# Patient Record
Sex: Male | Born: 1935 | Race: White | Hispanic: No | Smoking: Former smoker
Health system: Southern US, Community
[De-identification: ages and names within clinical notes are randomized; demographics above are authoritative.]

## PROBLEM LIST (undated history)

## (undated) DIAGNOSIS — E039 Hypothyroidism, unspecified: Secondary | ICD-10-CM

## (undated) DIAGNOSIS — D649 Anemia, unspecified: Secondary | ICD-10-CM

## (undated) DIAGNOSIS — I10 Essential (primary) hypertension: Secondary | ICD-10-CM

## (undated) DIAGNOSIS — K449 Diaphragmatic hernia without obstruction or gangrene: Secondary | ICD-10-CM

## (undated) DIAGNOSIS — I219 Acute myocardial infarction, unspecified: Secondary | ICD-10-CM

## (undated) DIAGNOSIS — R42 Dizziness and giddiness: Secondary | ICD-10-CM

## (undated) DIAGNOSIS — M1990 Unspecified osteoarthritis, unspecified site: Secondary | ICD-10-CM

## (undated) DIAGNOSIS — Z8719 Personal history of other diseases of the digestive system: Secondary | ICD-10-CM

## (undated) DIAGNOSIS — I4891 Unspecified atrial fibrillation: Secondary | ICD-10-CM

## (undated) DIAGNOSIS — J449 Chronic obstructive pulmonary disease, unspecified: Secondary | ICD-10-CM

## (undated) DIAGNOSIS — Z8679 Personal history of other diseases of the circulatory system: Secondary | ICD-10-CM

## (undated) DIAGNOSIS — N400 Enlarged prostate without lower urinary tract symptoms: Secondary | ICD-10-CM

## (undated) DIAGNOSIS — R0602 Shortness of breath: Secondary | ICD-10-CM

## (undated) DIAGNOSIS — I2510 Atherosclerotic heart disease of native coronary artery without angina pectoris: Secondary | ICD-10-CM

## (undated) DIAGNOSIS — E785 Hyperlipidemia, unspecified: Secondary | ICD-10-CM

## (undated) HISTORY — DX: Unspecified atrial fibrillation: I48.91

## (undated) HISTORY — DX: Personal history of other diseases of the digestive system: Z87.19

## (undated) HISTORY — DX: Personal history of other diseases of the circulatory system: Z86.79

## (undated) HISTORY — PX: BOWEL RESECTION: SHX1257

## (undated) HISTORY — PX: RETINAL DETACHMENT SURGERY: SHX105

## (undated) HISTORY — DX: Hyperlipidemia, unspecified: E78.5

## (undated) HISTORY — DX: Atherosclerotic heart disease of native coronary artery without angina pectoris: I25.10

## (undated) HISTORY — PX: HERNIA REPAIR: SHX51

## (undated) HISTORY — DX: Dizziness and giddiness: R42

## (undated) HISTORY — PX: TONSILLECTOMY AND ADENOIDECTOMY: SHX28

## (undated) HISTORY — DX: Anemia, unspecified: D64.9

## (undated) HISTORY — DX: Diaphragmatic hernia without obstruction or gangrene: K44.9

## (undated) HISTORY — DX: Hypothyroidism, unspecified: E03.9

## (undated) HISTORY — DX: Unspecified osteoarthritis, unspecified site: M19.90

## (undated) HISTORY — DX: Benign prostatic hyperplasia without lower urinary tract symptoms: N40.0

## (undated) HISTORY — DX: Essential (primary) hypertension: I10

---

## 1993-08-28 DIAGNOSIS — I219 Acute myocardial infarction, unspecified: Secondary | ICD-10-CM

## 1993-08-28 HISTORY — DX: Acute myocardial infarction, unspecified: I21.9

## 1997-05-28 HISTORY — PX: CORONARY ANGIOPLASTY WITH STENT PLACEMENT: SHX49

## 2003-12-21 ENCOUNTER — Ambulatory Visit (HOSPITAL_COMMUNITY): Admission: RE | Admit: 2003-12-21 | Discharge: 2003-12-21 | Payer: Self-pay | Admitting: Cardiovascular Disease

## 2003-12-21 HISTORY — PX: CARDIAC CATHETERIZATION: SHX172

## 2004-01-21 ENCOUNTER — Inpatient Hospital Stay (HOSPITAL_COMMUNITY): Admission: RE | Admit: 2004-01-21 | Discharge: 2004-02-10 | Payer: Self-pay | Admitting: Surgery

## 2004-01-21 HISTORY — PX: CORONARY ARTERY BYPASS GRAFT: SHX141

## 2004-01-29 ENCOUNTER — Encounter: Payer: Self-pay | Admitting: Cardiovascular Disease

## 2010-08-10 ENCOUNTER — Ambulatory Visit: Payer: Self-pay | Admitting: Cardiovascular Disease

## 2010-09-18 ENCOUNTER — Encounter: Payer: Self-pay | Admitting: Surgery

## 2011-01-13 NOTE — Discharge Summary (Signed)
NAME:  William Chambers, PROUTY NO.:  [id number]   MEDICAL RECORD NO.:  [id number]                   PATIENT TYPE:  INP   LOCATION:  2007                                 FACILITY:  [facility]   PHYSICIAN:  Evelene Croon, M.D.                  DATE OF BIRTH:  [birth year]   DATE OF ADMISSION:  01/21/2004  DATE OF DISCHARGE:  02/10/2004                                 DISCHARGE SUMMARY   HISTORY OF PRESENT ILLNESS:  Mr. William Chambers is a 77 year old active gentleman  referred to Dr. Laneta Simmers with a history of coronary artery disease status post  both myocardial infarction and PTCA of the right coronary artery in 1998.  The patient presented recently with atypical episodes of chest pain.  He has  multiple aches and pains but primarily described substernal chest tightness  or what he calls crowding.  The symptoms sometimes occur with exertion but  also at rest.  He has also noted indigestion with belching.  The episodes  usually resolve spontaneously.  Due to these symptoms cardiology evaluation  was undertaken and this included a stress Cardiolite exam that showed ST  segment depression at a low exercise level and showed inferior myocardial  infarction.  He underwent a cardiac catheterization on December 21, 2003 which  showed significant three vessel coronary disease.  The left main had a 30%  distal stenosis.  The LAD was calcified throughout its course.  There was a  50-70% mid LAD stenosis.  The distal LAD beyond the second diagonal branch  had a long 90% stenosis.  The first and second diagonal branches both had  about 50-60% stenosis.  The left circumflex had a 95% hazy lesion at its  proximal segment and then a 60-70% mid arterial stenosis.  The right  coronary artery was a large dominant vessel.  There is a 60% stenosis before  the takeoff of the posterior descending branch.  There is about 95% stenosis  of the posterolateral branch.  The posterior descending branch had  disease  in its mid portion with a moderate degree of stenosis that Dr. Laneta Simmers  estimated to be about 60%.  Left ventricular ejection fraction was 65% with  mid inferior akinesis.  There was no gradient across the aortic valve and no  significant mitral regurgitation.  Due to these findings Dr. Laneta Simmers  recommended surgical revascularization and he was admitted this  hospitalization for the procedure.   ALLERGIES:  None.   PAST MEDICAL HISTORY:  1. Coronary artery disease as described above.  2. Hyperlipidemia.  3. Hypertension.  4. Hypothyroidism.  5. Adult-onset diabetes mellitus.  6. History of gout.  7. Borderline glaucoma.  8. 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  9. Additionally he has had three inguinal hernia surgeries as well as     appendectomy, tonsillectomy and  adenoidectomy.   MEDICATIONS PRIOR TO ADMISSION:  1. Lopressor 50 mg b.i.d.  2. Allopurinol 300 mg once daily.  3. Uroxatral 10 mg daily.  4. Levoxyl 0.05 mg once daily.  5. Glucophage 500 mg once daily.  6. Lipitor 10 mg once daily.  7. Avapro 150 mg once daily.  8. Digoxin 0.25 mg once daily.  9. Axid 75 mg four times a day.  10.      Aspirin 325 mg daily.  11.      Tylenol 500 mg q.i.d. p.r.n.  12.      Claritin 10 mg once daily.  13.      Sublingual nitroglycerin p.r.n.  14.      Inderal 10 mg p.r.n. for irregular heart rhythm.  15.      Soma p.r.n.  16.      Alpha lipoic acid 300 mg four to six times daily.  17.      Evening primrose oil 500 mg two to four times per day.  18.      Multivitamins.   FAMILY HISTORY, SOCIAL HISTORY, REVIEW OF SYMPTOMS AND PHYSICAL EXAMINATION:  Please see the history and physical done at the time of admission.   HOSPITAL COURSE:  The patient was admitted, electively taken to the cardiac  operating room where he underwent the following procedure on Jan 21, 2004,  coronary artery bypass grafting x6.   The following grafts were placed:  (1)  The left internal mammary artery to the left anterior descending coronary  artery, (2) a saphenous vein graft to the second diagonal branch of the left  anterior descending coronary artery, (3) sequential saphenous vein graft to  the first diagonal branch of the left anterior descending and then the  obtuse marginal coronary artery and the posterolateral branch of the right  coronary artery, (4) a saphenous vein graft to the posterior descending  coronary artery.  This procedure was performed by Evelene Croon, M.D.  Patient tolerated procedure well, was taken to the surgical intensive care  unit in stable condition.   POSTOPERATIVE HOSPITAL COURSE:  Patient has had a complex postoperative  hospital course due to several factors.  Initially the patient was extubated  without difficulty and neurologically intact with very stable hemodynamics.  All routine lines, monitors and drainage devices were discontinued in the  standard fashion.  He did develop an initial postoperative atrial  fibrillation.  He did have a history of some irregular heart rhythms  presumed to be atrial fibrillation and he was treated with chemical  cardioversion.  Patient also had a postoperative anemia that was felt to  require transfusion on Jan 25, 2004 for a hemoglobin of 7.1.  He responded  well to transfusion.  He continued to have difficulty with atrial  fibrillation and it was subsequently felt that he required coumadinization.  Coumadin was started and daily INR's were monitored.  The patient on January 28, 2004 had an episode of right upper quadrant pain with nausea.  It was  unclear of the exact etiology but he also had an associated hypotension.  EKG showed no acute changes.  He does have a history of some gallbladder  disease and it was uncertain if this could be an issue.  He was transferred to the intensive care unit and enzymes were obtained.  Abdominal examination   was followed closely.  He remained hypotensive and required a fluid  challenge as well as dopamine.  He developed increasing shortness of breath  and diaphoresis and subsequently a Swan-Ganz catheter was placed.  Initial  CVP was 12-14 and PAD was 8-10 but he continued to have persistent  hypotension.  The patient then developed increasing respiratory difficulties  and blood pressure became unmeasurable and he required CPR.  He was treated  with advanced cardiac life support modalities including CPR, epinephrine,  dopamine and bicarbonate and stabilized.  A stat echocardiogram was obtained  and this showed an anterior fluid collection in the pericardium.  He was  uncertain if this was the etiology of the hypotension or secondary to CPR  but Dr. Sheliah Plane felt as though the patient would require an  exploration in the operating room and this was done at 4 a.m. on January 29, 2004 by him.  The chest was explored and there was some evidence of anterior  clot.  The anastomoses and all potential bleeding points were evaluated and  were felt to be hemostatic.  The patient's sternum was closed and he  returned to the intensive care unit.   POSTOPERATIVE HOSPITAL COURSE FOLLOWING RE-EXPLORATION:  The patient has  shown a good and gradual improvement following this second procedure.  The  patient has not been restarted on Coumadin and he has subsequently returned  to a normal sinus rhythm on current pharmacological regimen.  The patient's  laboratory values are stable.  His incisions are healing well without signs  of infection.  He has had some difficulty with his benign prostatic  hyperplasia and urology consultation has been obtained with Dr. Vonita Moss.  He has undergone a post-Foley catheter voiding challenge.  He initially  failed this and catheter had to be returned.  He is currently being trained  for self-catheterization if he fails subsequent retrial at removal of the  Foley.  The  patient has had significant difficulty relating to  rehabilitation due to chronic deconditioning as well as the acute  hospitalization.  He has multiple complaints regarding his musculoskeletal  system and has had both physical and occupational therapy consultations and  recommendations for further treatment.  He is responding well albeit  somewhat slowly and is felt to be a candidate for further rehabilitation in  the nursing home setting as he is not felt to be safe at this time for self-  care.  Overall patient is felt at this time to be stable for transfer in the  morning of February 10, 2004 pending morning round re-evaluation.   CURRENT MEDICATIONS:  1. Dulcolax 10 mg daily.  2. Colace 200 mg daily.  3. Aspirin 325 mg daily.  4. Protonix 40 mg daily.  5. Digoxin 0.25 mg daily.  6. Synthroid 50 mcg daily.  7. Zyloprim 300 mg daily.  8. Claritin 10 mg daily.  9. Plavix 75 mg daily.  10.      Lopressor 50 mg b.i.d. 11.      Colchicine 0.6 mg daily.  12.      Guaifenesin 600 mg b.i.d.  13.      Amaryl 2 mg daily.  14.      Lasix 40 mg once daily.  15.      Potassium chloride 20 mEq daily.  16.      For pain Tylox one to two q.4-6h. p.r.n. as needed.   CONDITION ON DISCHARGE:  Stable and improving.   FINAL DIAGNOSIS:  Severe multivessel coronary artery disease as described  above now status post surgical revascularization with re-exploration due to  cardiac tamponade and cardiopulmonary arrest.  OTHER DIAGNOSES INCLUDE:  1. Hyperlipidemia.  2. Hypothyroidism.  3. Hypertension.  4. Gout.  5. Atrial fibrillation.  6. Postoperative anemia.  7. 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  8. History of inguinal hernia repair x3.  9. History of appendectomy, tonsillectomy, adenoidectomy.   RECENT LABORATORIES:  Most recent hemoglobin and hematocrit dated February 05, 2004 are stable at 8.7 and 26.4.  Most recent chemistry as follows:  Sodium  132, potassium 3.6  for which he received supplement, chloride 99, CO2 24,  BUN 9, creatinine 1, glucose 100.   INSTRUCTIONS:  Followup:  Patient should see Dr. Evelene Croon at the  [facility] on March 15, 2004  at 12:30 p.m. with arrival 1 hour prior to appointment at the [facility] for a chest x-ray.  The [facility]  telephone number is as follows [phone].  Additionally the patient  should follow up 2 weeks post discharge from the hospital to his  cardiologist's office Kristeen Miss, M.D.  He should call the office for  this.   OTHER INSTRUCTIONS:  Patient should continue physical and occupational  therapy for rehabilitation and deconditioning.  Patient has also been  instructed on self-catheterization using Foley for urinary retention.  Patient should follow up with Dr. Vonita Moss or his primary urologist in 2-3  weeks or sooner as required by clinical symptoms.   INSTRUCTIONS FOR INCISION CARE:  The patient may shower, clean incisions  gently with soap and water, he is encouraged also to ambulate increasing as  tolerated with use of walker.  Additionally when not ambulating he is  instructed to keep his lower extremities elevated above the level of his  heart as possible.  He is also instructed not to lift anything greater than  10 pounds maximum weight and to use continued care in regards to pushing and  pulling movements that cause strain on his sternum.      Rowe Clack, P.A.-C.                    Evelene Croon, M.D.    Sherryll Burger  D:  02/09/2004  T:  02/09/2004  Job:  [id number]   cc:   Evelene Croon, M.D.  [address]  [place]  [place] [address]  Fax: [phone]   Vesta Mixer, M.D.  [address], [address]  [place]  [place] [address]  Fax: [phone]   Maretta Bees. Vonita Moss, M.D.  [address]  [place]  [place] [address]  Fax: [phone]

## 2011-01-13 NOTE — Op Note (Signed)
NAME:  William Chambers, William Chambers NO.:  [id number]   MEDICAL RECORD NO.:  [id number]                   PATIENT TYPE:  INP   LOCATION:  2306                                 FACILITY:  [facility]   PHYSICIAN:  Evelene Croon, M.D.                  DATE OF BIRTH:  Feb 18, 1923   DATE OF PROCEDURE:  01/21/2004  DATE OF DISCHARGE:                                 OPERATIVE REPORT   PREOPERATIVE DIAGNOSIS:  Severe three-vessel coronary artery disease.   POSTOPERATIVE DIAGNOSIS:  Severe three-vessel coronary artery disease.   OPERATIVE PROCEDURES:  1. Median sternotomy.  2. Extracorporeal circulation.  3. Coronary artery bypass graft surgery x6 using a left internal mammary     artery graft to the left anterior descending coronary artery, with a     saphenous vein graft to the second diagonal branch of the left anterior     descending coronary artery, with a sequential saphenous vein graft to the     first diagonal branch of the left anterior descending and then the obtuse     marginal coronary artery and the posterolateral branch of the right     coronary artery, and a saphenous vein graft to the posterior descending     coronary artery.  4. Endoscopic vein harvesting from both legs.   ATTENDING SURGEON:  Evelene Croon, M.D.   ASSISTANT:  Jerold Coombe, P.A.   SECOND ASSISTANT:  Erskine Squibb, R.N.F.A.   ANESTHESIA:  General endotracheal.   CLINICAL HISTORY:  This patient is a 77 year old gentleman with a history of  coronary artery disease, status post myocardial infarction and PTCA of the  right coronary artery in 1998.  He recently presented with atypical chest  pain.  Stress Cardiolite showed ST depression and evidence of previous  inferior myocardial infarction.  A cardiac catheterization on December 21, 2003, shows severe three-vessel coronary artery disease.  The left main had  about 30% distal stenosis.  The LAD was calcified throughout its course.  There was a  50-75% mid-LAD stenosis.  The distal LAD beyond the second  diagonal branch had a long 90% stenosis.  The first and second diagonal  branches both had about 50-60% stenosis.  The left circumflex had a 95% hazy  lesion at its proximal portion and then a 60-70% midvessel stenosis.  The  right coronary artery was a large, dominant vessel that had 60% stenosis  before the takeoff of the posterior descending branch and about 95% stenosis  in the posterolateral branch.  The posterior descending branch had stenosis  in its midportion of at least 60%.  Left ventricular ejection fraction was  about 65% with midinferior akinesis.  There was no gradient across the  aortic valve and no significant mitral regurgitation.  After review of the  angiogram and examination of the patient, it was felt that coronary artery  bypass graft surgery was the  best treatment.  I discussed the operative  procedure with the patient and his family, including alternatives, benefits,  and risks, including bleeding, blood transfusion, infection, stroke,  myocardial infarction, graft failure, and death.  He understood and agreed  to proceed.   OPERATIVE PROCEDURE:  The patient was taken to the operating room and placed  on the table in supine position.  After induction of general endotracheal  anesthesia, a Foley catheter was placed in the bladder using sterile  technique.  Then the chest, abdomen, and both lower extremities were prepped  and draped in the usual sterile manner.  The chest was entered through a  median sternotomy incision and the pericardium opened in the midline.  Examination of the heart showed good ventricular contractility.  The  ascending aorta had no palpable plaques in it.   Then the left internal mammary artery was harvested from the chest wall as a  pedicle graft.  This was a medium-caliber vessel with excellent blood flow  through it.  At the same time a segment of greater saphenous vein was   harvested from both thighs using endoscopic vein harvest technique.  This  vein was of medium size and good quality.   Then the patient was heparinized and when an adequate activated clotting  time was achieved, the distal ascending aorta was cannulated using a 20  French aortic cannula for arterial inflow.  Venous outflow was achieved  using a two-stage venous cannula through the right atrial appendage.  An  antegrade cardioplegia and vent cannula was inserted in the aortic root.   The patient was placed on cardiopulmonary bypass and the distal coronary  arteries identified.  The LAD was a medium-sized graftable vessel.  The  first diagonal was intramyocardial but was located in its midportion and was  a large, graftable vessel.  The second diagonal branch was a large vessel  proximally.  It was diffusely diseased, and I had to graft it distally to  get beyond the disease, where it was a smaller vessel.  The first marginal  branch was a large, graftable vessel.  The posterolateral branch of the  right coronary artery was small but graftable.  The posterior descending  coronary artery was diffusely diseased in its proximal and midportion and  was a large, graftable vessel distally beyond the disease.   Then the aorta was crossclamped and 500 mL of cold blood antegrade  cardioplegia was administered in the aortic root with quick arrest of the  heart.  Systemic hypothermia to 20 degrees Centigrade and topical  hypothermia with iced saline was used.  A temperature probe was placed in  the septum and an insulating pad in the pericardium.   The first distal anastomosis was performed to the posterior descending  coronary artery.  The internal diameter was about 1.6 mm.  The conduit used  was a segment of greater saphenous vein and the anastomosis performed in an  end-to-side manner using continuous 7-0 Prolene suture.  Flow was measured through the graft and was excellent.   The second  distal anastomosis was performed to the second diagonal branch.  The internal diameter of this vessel distally was about 1.5 mm.  The conduit  used was a second segment of greater saphenous vein and the anastomosis  performed in an end-to-side manner using continuous 7-0 Prolene suture.  Flow was measured through the graft and was excellent.  Then a dose of  cardioplegia was given down both vein grafts and in the  aortic root.   The third distal anastomosis was performed to the first diagonal branch.  The internal diameter was about 1.6 mm.  The conduit used was a third  segment of greater saphenous vein and the anastomosis performed in a  sequential side-to-side manner using continuous 7-0 Prolene suture.  Flow  was measured through the graft and was excellent.   The fourth distal anastomosis was performed to the obtuse marginal branch.  The internal diameter was about 1.75 mm.  The conduit used was the same  segment of greater saphenous vein and the anastomosis was performed in a  sequential side-to-side manner using continuous 7-0 Prolene suture.  Flow  was again measured through the graft and was excellent.   The fifth distal anastomosis was performed to the posterolateral branch of  the right coronary artery.  The internal diameter of this vessel was about  1.5 mm.  The conduit used was the same segment of greater saphenous vein and  the anastomosis performed in a sequential end-to-side manner using  continuous 8-0 Prolene suture.  Flow was measured through that graft and was  excellent.   The sixth distal anastomosis was performed to the midportion of the left  anterior descending coronary artery.  The internal diameter of this vessel  was 1.75 mm.  The conduit used was the left internal mammary graft, and this  was brought through an opening in the left pericardium anterior to the  phrenic nerve.  It was anastomosed to the LAD in an end-to-side manner using  continuous 8-0  Prolene suture.  The pedicle was tacked to the epicardium  with 6-0 Prolene suture.  The patient rewarmed to 37 degrees Centigrade and  the clamp removed from the mammary pedicle.  There was rapid warming of the  ventricular septum and return of spontaneous ventricular fibrillation.  The  crossclamp was removed with a time of 92 minutes and the patient  spontaneously converted to sinus rhythm.   A partial occlusion clamp was placed on the aortic root and the three  proximal vein graft anastomoses were performed in an end-to-side manner  using continuous 6-0 Prolene suture.  The clamp was removed and the vein  grafts de-aired and the clamps removed from them.  The proximal and distal  anastomoses appeared hemostatic and the alignment of the grafts  satisfactory.  Graft markers were placed around the proximal anastomoses.  Two temporary right ventricular and right atrial pacing wires were placed and brought out through the skin.   When the patient had rewarmed to 37 degrees Centigrade, he was weaned from  cardiopulmonary bypass on no inotropic agents.  Total bypass time was 143  minutes.  Cardiac function appeared excellent with a cardiac output of 5  L/min.  Protamine was given and the venous and aortic cannulas were removed  without difficulty.  Hemostasis was achieved.  The patient was given 10  units of platelets due to coagulopathy, which was noted at the beginning of  the case and continued afterwards.  This resulted in adequate hemostasis.  Four chest tubes were placed with a tube in the posterior pericardium, one  in the left pleural space, one in the right pleural space, and one in the  anterior mediastinum.  The pericardium was loosely reapproximated over the  heart.  The sternum was closed with #6 stainless steel wires.  The fascia  was closed with continuous #1 Vicryl suture.  The subcutaneous tissue was  closed with continuous 2-0 Vicryl and the  skin with 3-0 Vicryl  subcuticular  closure.  The lower extremity vein harvest sites were closed in layers in a  similar manner.  The sponge, needle, and instrument counts were correct  according to the scrub nurse.  Dry sterile dressings were applied over the  incisions and around the chest tubes, which were hooked to Pleuravac  suction.  The patient remained hemodynamically stable and was transported to  the SICU in guarded but stable condition.                                               Evelene Croon, M.D.    BB/MEDQ  D:  01/21/2004  T:  01/22/2004  Job:  [id number]   cc:   Vesta Mixer, M.D.  [address], [address]  [place]  [place] [address]  Fax: [phone]   [facility]

## 2011-01-13 NOTE — Consult Note (Signed)
NAME:  William Chambers, William Chambers NO.:  [id number]   MEDICAL RECORD NO.:  [id number]                   PATIENT TYPE:  INP   LOCATION:  2007                                 FACILITY:  [facility]   PHYSICIAN:  Maretta Bees. Vonita Moss, M.D.             DATE OF BIRTH:  February 03, 1923   DATE OF CONSULTATION:  02/08/2004  DATE OF DISCHARGE:                                   CONSULTATION   REASON FOR CONSULTATION:  I was asked to see this pleasant 77 year old white  male who has been hospitalized for severe three vessel coronary artery  bypass grafting and a previous history of coronary artery disease.   HISTORY OF PRESENT ILLNESS:  He was seen by Dr. Deloris Ping Nahser for a  cardiac workup thinking that he might have a TUR of the prostate because of  increased voiding symptoms despite treatment with Cardura and Flomax over  the years.  He was seen by Dr. Hollice Espy in [place], [place].  The cardiac workup lead to abnormal findings prompting his surgery.   REVIEW OF SYMPTOMS:  On admission, he also presented with a history of  environmental allergies, migraine headaches, occasional diarrhea from  previous small bowel surgery, and history of gout.   PAST SURGICAL HISTORY:  1. He has a previous surgery including tonsillectomy.  2. Appendectomy.  3. Inguinal hernia repairs x 3.  4. Small bowel resection.  5. Retinal surgery.   MEDICATIONS:  1. Calcium and vitamin D.  2. Lopressor.  3. Allopurinol.  4. Uroxatral 10 mg.  5. Levoxyl 0.05 mg.  6. Glucophage 500 mg.  7. Lipitor 10 mg.  8. Avapro 150 mg q.d.  9. Digoxin 0.25 mg.  10.      Claritin 10 mg q.d.  11.      Axid 75 mg t.i.d.  12.      Aspirin 325 mg.  13.      Nitrostat p.r.n.  14.      Inderal.  15.      Soma.  16.      Multiple vitamins.   ALLERGIES:  Denied.   SOCIAL HISTORY:  He has a history of alcohol abuse.  He does not smoke  cigarettes.   FAMILY HISTORY:  Noncontributory.   HOSPITAL  COURSE:  During the course of this hospital stay, he had Foley  catheter and since this has been removed he has complete voiding.  The Foley  catheter was put in because of a residual urine of 900 cc.  Foley catheter  is now in place and the patient is comfortable.  Dr. Jillyn Hidden B. Truett Perna said  that he thinks his recent PSA level was in the level of 5 to 6 range.   PHYSICAL EXAMINATION:  GENERAL:  He is a pleasant white male in no acute  distress.  He is alert and oriented.  SKIN:  Warm and dry.  ABDOMEN:  No CVA or bladder tenderness.  No inguinal adenopathy.  GENITOURINARY:  Penis, urethra meatus, testicles, and epididymes is  unremarkable except for a Foley catheter.  RECTAL:  Perineum shows normal sphincter tone.  Normal prostate.  Feels  benign, smooth, and soft.  No seminal vesicle tissue palpated.   IMPRESSION:  Benign prostatic hypertrophy and prostatism complicated by  postoperative urinary retention.   PLAN:  1. Continue Uroxatral.  2. Foley catheter out tomorrow for a voiding trial.  3. Teach the patient self catheterization so that he might be able to go     home on that if he cannot void on his own.  However, the patient may     require TUR of the prostate or microwave of the prostate at some time in     the future.  I told him TUR of the prostate would be most definitive but     a microwave possibly may work, although the chances of working are less     so in the face of urinary retention.                                               Maretta Bees. Vonita Moss, M.D.    LJP/MEDQ  D:  02/08/2004  T:  02/09/2004  Job:  [id number]   cc:   Vesta Mixer, M.D.  [address]  [place]  [place] [address]  Fax: [phone]   Evelene Croon, M.D.  [address]  [place]  [place] [address]  Fax: [phone]

## 2011-01-13 NOTE — Cardiovascular Report (Signed)
NAME:  NILO, FALLIN NO.:  [id number]   MEDICAL RECORD NO.:  [id number]                   PATIENT TYPE:  OIB   LOCATION:  2852                                 FACILITY:  [facility]   PHYSICIAN:  Vesta Mixer, M.D.              DATE OF BIRTH:  05-Nov-1922   DATE OF PROCEDURE:  12/21/2003  DATE OF DISCHARGE:                              CARDIAC CATHETERIZATION   Mr. Gaddie is a 77 year old gentleman with a history of coronary artery  disease.  He is status post percutaneous transluminal coronary angioplasty  of his right coronary artery back in 1998.  He presented recently with some  episodes of chest pain and was found to have an early positive stress test.  He was referred for heart catheterization for further evaluation.   PROCEDURE:  Left heart catheterization with coronary angiography.   The right femoral artery was easily cannulated using the modified Seldinger  technique.   HEMODYNAMICS:  The left ventricular pressure was 113/11 with an aortic  pressure of 113/57.   CORONARY ANGIOGRAPHY:  1. Left main coronary artery has a distal stenosis of approximately 30%.  2. The left anterior descending artery is moderately calcified throughout     its course.  There are proximal luminal irregularities.  The mid LAD has     a 50-70% stenosis.  The distal LAD has a long 90% stenosis.  The first     diagonal vessel is a moderate size vessel and has a 50% stenosis.  The     second diagonal has a mid 50% stenosis.  3. The left circumflex artery has a 95% hazy lesion in its proximal segment.     The mid circumflex has a 60-70% stenosis.  4. The right coronary artery is large and dominant.  The proximal and mid     segments have only minor luminal irregularities.  The distal percutaneous     transluminal coronary angioplasty site is okay in the proximal aspect of     the lesion, but then there is a 95% stenosis in the posterior lateral     branch.  The  posterior descending artery has only minor luminal     irregularities.   LEFT VENTRICULOGRAM:  Left ventriculogram was performed in a 30-RAO  position.  It reveals overall normal left ventricular systolic function with  an ejection fraction of approximately 65%.  There is mid inferior akinesis.  The remaining walls seemed to contract fairly normally.   COMPLICATIONS:  None.   CONCLUSIONS:  Three-vessel coronary artery disease.  These vessels are  fairly small and are not ideal candidates for angioplasty.  We will consult  CVTS for surgical opinion.  Vesta Mixer, M.D.    PJN/MEDQ  D:  12/21/2003  T:  12/21/2003  Job:  [id number]   cc:   CVTS   Annette Stable Hasanaj  [address]  [place]  [place] [address]  Fax: [phone]

## 2011-01-13 NOTE — H&P (Signed)
NAME:  William Chambers, William Chambers NO.:  [id number]   MEDICAL RECORD NO.:  [id number]                   PATIENT TYPE:  OIB   LOCATION:                                       FACILITY:  [facility]   PHYSICIAN:  Vesta Mixer, M.D.              DATE OF BIRTH:  [birth year]   DATE OF ADMISSION:  12/21/2003  DATE OF DISCHARGE:                                HISTORY & PHYSICAL   Mr. Steinberger is a 77 year old gentleman with a history of coronary artery  disease.  He is status post PTCA of his right coronary artery back 1990.  He  recently presented with some rather atypical episodes of chest pain.  A  stress Cardiolite study was positive in that he had ST segment depression at  relatively low exercise level.  In addition he had fairly significant  attenuation in the inferior wall consistent with an inferior wall myocardial  infarction.  He has continued to have some intermittent episodes of chest  discomfort.  He has not had to take nitroglycerin but the pain usually  resolves after a brief rest.   He is now admitted for heart catheterization.   CURRENT MEDICATIONS:  1. Aspirin 325 mg a day.  2. Lipitor 10 mg a day.  3. Synthroid 0.05 mg a day.  4. Lopressor 50 mg p.o. b.i.d.  5. Axid 75 mg three times a day.  6. Allopurinol 300 mg a day.  7. Avapro 150 mg a day.  8. Digoxin 0.25 mg a day.  9. Uroxatral 10 mg a day.  10.      Glucophage 500 mg a day.   ALLERGIES:  He has no known drug allergies.   PAST MEDICAL HISTORY:  1. History of coronary artery disease.  2. Hyperlipidemia.  3. Hypothyroidism.  4. Hypertension.  5. Gout.   SOCIAL HISTORY:  The patient is a nonsmoker.  He does not drink alcohol to  excess.   FAMILY HISTORY:  Noncontributory.   REVIEW OF SYSTEMS:  His review of systems was reviewed and is essentially  negative except for as noted above.   PHYSICAL EXAMINATION:  GENERAL: He is an elderly gentleman in no acute  distress.  He is alert and  oriented x3 and his mood and affect are normal.  VITAL SIGNS: His weight is 211, his blood pressure is 110/60 with a heart  rate of 68.  HEENT/NECK: Reveals 2+ carotids.  He has no bruits.  There is no JVD.  No  thyromegaly.  LUNGS: Clear to auscultation.  HEART: Regular rate.  S1 and S2 with no murmurs, gallops or rubs.  ABDOMINAL EXAM: Reveals good bowel sounds and is nontender.  EXTREMITIES: He has no clubbing, cyanosis or edema.  NEURO EXAM: Nonfocal.   His stress Cardiolite study reveals an inferior scar.  He has clinical  symptoms and EKG signs of ischemia.  We have discussed the  risks, benefits  and options of heart catheterization.  He understands and agrees to proceed.                                                Vesta Mixer, M.D.    PJN/MEDQ  D:  12/15/2003  T:  12/16/2003  Job:  [id number]   cc:   Annette Stable Hasanaj  [address]  [place]  [place] [address]  Fax: [phone]

## 2011-01-13 NOTE — Op Note (Signed)
NAME:  William Chambers, William Chambers NO.:  [id number]   MEDICAL RECORD NO.:  [id number]                   PATIENT TYPE:  INP   LOCATION:  2316                                 FACILITY:  [facility]   PHYSICIAN:  Gwenith Daily. Tyrone Sage, M.D.            DATE OF BIRTH:  1923-03-28   DATE OF PROCEDURE:  DATE OF DISCHARGE:  01/28/2004                                 OPERATIVE REPORT   PREOPERATIVE DIAGNOSIS:  Delayed postoperative cardiac tamponade.   POSTOPERATIVE DIAGNOSIS:  Delayed postoperative cardiac tamponade.   SURGICAL PROCEDURE:  Mediastinal exploration and evacuation of pericardial  hematoma.   SURGEON:  Gwenith Daily. Tyrone Sage, M.D.   BRIEF HISTORY:  The patient is a 77 year old male who, approximately one  week prior, had undergone coronary artery bypass grafting and had been doing  reasonably well to the point of being ready for discharge in one to two  days.  On the evening prior to emergency surgery, he had been walking in the  hall without any difficulty and then after a dose of Lopressor, developed  right upper quadrant pain, significant diaphoresis and hypotension.  The  patient had been on Coumadin and Lovenox for recurrent atrial fibrillation.  His INR was 1.2 and because of these findings he was transferred to the  surgical intensive care unit and with volume resuscitation responded  initially, however, then became hypotensive.  In the process, while putting  Swan-Ganz arterial lines in the patient, he suffered a witnessed respiratory  decline and was bagged and intubated.  In the meantime, also became  profoundly hypotensive and lost blood pressure, requiring CPR.  He responded  to epinephrine and calcium.  Emergency echocardiogram was obtained that gave  evidence of pericardial clot.  He was returned to the operating room as  emergency.   DESCRIPTION OF PROCEDURE:  The patient was brought to the operating room  with a Swan-Ganz and arterial line monitors  already in place and intubated.  Before general anesthesia and following his cardiac arrest, he did open his  eyes and follow commands.  The skin of his chest was prepped with Betadine  and draped in the usual sterile manner.  Initially the lower part of the  sternal incision was opened, transesophageal echo probe was also placed,  giving greater detail than the previous echo, showing that there was a  significant posterior clot and inferior clot.  Through the opening of the  lower sternum, old clot was identified in the pericardium; however, we could  not satisfactorily remove it as it was in the vicinity of the posterior  descending coronary graft and the posterolateral coronary graft.  It was  decided to remove all the sternal wires and reopen the chest. The chest was  carefully explored.  Each vein graft appeared to be intact and patent.  With  some difficulty, the clot was removed from around posteriorly and  inferiorly.  There was very  little anterior clot evident.  The anastomosis  all appeared to be without bleeding.  The pericardial cavity was irrigated.  With the operative field hemostatic, the chest was then closed with two  mediastinal tubes.  The pericardium was left open.  #6 and double sternal  wires were placed and the chest closed.  The fascia was closed with  interrupted 0 Vicryl and running 3-0 Vicryl in the subcutaneous tissues,  skin staples in the skin edges.  Dry dressing was applied.  The patient  tolerated the procedure without obvious complication and following procedure  maintained good hemodynamic stability with preserved LV function and RV  function based on transesophageal echo.                                               Gwenith Daily Tyrone Sage, M.D.   Tyson Babinski  D:  02/01/2004  T:  02/01/2004  Job:  [id number]

## 2011-01-13 NOTE — Op Note (Signed)
NAME:  TAMEEM, PULLARA NO.:  [id number]   MEDICAL RECORD NO.:  [id number]                   PATIENT TYPE:  INP   LOCATION:  2316                                 FACILITY:  [facility]   PHYSICIAN:  Guadalupe Maple, M.D.               DATE OF BIRTH:  Apr 01, 1923   DATE OF PROCEDURE:  01/29/2004  DATE OF DISCHARGE:                                 OPERATIVE REPORT   PROCEDURE:  Intraoperative transesophageal echocardiography.   ANESTHESIOLOGIST:  Guadalupe Maple, M.D.   INDICATION:  Mr. Farmer Mccahill is a 78 year old white male who underwent  coronary artery bypass grafting x6 on Jan 21, 2004 by Dr. Evelene Croon.  On  the evening of January 28, 2004, he developed hypotension and subsequently  suffered a cardiac arrest.  Transesophageal echocardiography revealed  possible pericardial tamponade and he was taken to the operating room for  mediastinal exploration.  Intraoperative transesophageal echocardiography  was to assess the heart and determine if there was any pericardial fluid  present and to assess the adequacy of evacuation of any pericardial fluid.   DESCRIPTION OF PROCEDURE:  The patient was brought to the operating room at  [facility].  He was intubated prior to coming to the operating  room.  The transesophageal echocardiography probe was inserted into the  esophagus without difficulty.   IMPRESSION:  1. Pericardium:  There was a 3-cm echo-free space in the pericardium     posterior to the left and right ventricles.  This appeared to be     loculated as well.  There was also a 3- to 4-cm echo-free space superior     to this region which was posterior to the left and right atria but did     not appear contiguous with the other fluid collection.  There was a 0.5-     cm echo-free space along the right lateral aspect of the heart in the     pericardium along the right ventricular surface.  The left ventricle     appeared under-filled but was  contracting vigorously.  The right     ventricle showed good contractility, was moderately dilated and showed no     evidence of diastolic collapse.  The right atrium was dilated as well and     there was no evidence of diastolic collapse.  2. The left ventricular function showed the left ventricle was underfilled     somewhat but showed good contractility with ejection fraction estimated     at 55% to 60%.  3. The mitral valve showed the leaflets to coapt well without prolapse or     fluttering and trace mitral insufficiency.  4. Aortic valve showed the aortic valve was trileaflet and structure opened     well without evidence of aortic stenosis.  There was no aortic     insufficiency, but there was a moderate amount of turbulence  in the left     ventricular outflow tract.  I was unable to obtain an adequate window for     continuous-wave Doppler interrogation of the left ventricular outflow     tract, but there was no evidence of systolic anterior motion of the     mitral valve noted.  5. Tricuspid valve showed trace tricuspid insufficiency.  6. The right ventricle was moderately dilated but the free wall contracted     well.  7. The interatrial septum was intact with no evidence of patent foramen     ovale or atrioseptal defect.  8. The left atrium appeared underfilled but there was no evidence of     thrombus in the left atrium or left atrial appendage.                                              Guadalupe Maple, M.D.   DCJ/MEDQ  D:  01/29/2004  T:  01/30/2004  Job:  [id number]

## 2011-01-20 ENCOUNTER — Other Ambulatory Visit: Payer: Self-pay | Admitting: Cardiovascular Disease

## 2011-01-20 MED ORDER — ROSUVASTATIN CALCIUM 10 MG PO TABS: 10.0000 mg | ORAL_TABLET | Freq: Every day | ORAL | Status: DC

## 2011-01-20 NOTE — Telephone Encounter (Signed)
Patient request refill

## 2011-01-20 NOTE — Telephone Encounter (Signed)
Has a question regarding his Crestor prescription refill.  He said that he would like the 90 day supply called into [facility] in [phone]

## 2011-01-24 ENCOUNTER — Other Ambulatory Visit: Payer: Self-pay | Admitting: *Deleted

## 2011-01-24 MED ORDER — ROSUVASTATIN CALCIUM 10 MG PO TABS: 10.0000 mg | ORAL_TABLET | Freq: Every day | ORAL | Status: DC

## 2011-01-24 NOTE — Progress Notes (Signed)
Fax received from pharmacy. Refill completed. Jodette Leoni Goodness RN  

## 2011-02-15 ENCOUNTER — Other Ambulatory Visit: Payer: Self-pay | Admitting: *Deleted

## 2011-02-22 ENCOUNTER — Encounter: Payer: Self-pay | Admitting: *Deleted

## 2011-03-03 ENCOUNTER — Ambulatory Visit (INDEPENDENT_AMBULATORY_CARE_PROVIDER_SITE_OTHER): Payer: Medicare Other | Admitting: Cardiovascular Disease

## 2011-03-03 ENCOUNTER — Encounter: Payer: Self-pay | Admitting: Cardiovascular Disease

## 2011-03-03 DIAGNOSIS — I2510 Atherosclerotic heart disease of native coronary artery without angina pectoris: Secondary | ICD-10-CM

## 2011-03-03 DIAGNOSIS — E785 Hyperlipidemia, unspecified: Secondary | ICD-10-CM | POA: Insufficient documentation

## 2011-03-03 DIAGNOSIS — E039 Hypothyroidism, unspecified: Secondary | ICD-10-CM

## 2011-03-03 DIAGNOSIS — I4891 Unspecified atrial fibrillation: Secondary | ICD-10-CM

## 2011-03-03 MED ORDER — ROSUVASTATIN CALCIUM 10 MG PO TABS: 10.0000 mg | ORAL_TABLET | Freq: Every day | ORAL | Status: DC

## 2011-03-03 NOTE — Assessment & Plan Note (Signed)
Pt is doing well from cardiac standpoint.  No angina.  Will continue current meds.

## 2011-03-03 NOTE — Assessment & Plan Note (Signed)
HR is still regular.

## 2011-03-03 NOTE — Assessment & Plan Note (Signed)
His triglyceride levels are still moderately elevated. He'll continue to work on his dietary restrictions. We'll continue current medications. We'll recheck his numbers again in 6 months.

## 2011-03-03 NOTE — Progress Notes (Signed)
William Chambers Date of Birth  [birth year] [facility] / [facility] [address]     [address] [place], [place]  [address] [phone]  Fax  [phone]  History of Present Illness:  William Chambers is feeling well.  Had labwork recently which was OK .    Walking some but not as much as he used to.  Eats some desserts but not often.  Does not eat much bread.  No chest pain.  Has trouble taking a deep breath - thinks his sternum is still tight from surgery years ago  He remembered lab work from [facility], [facility]  Chol - 155. LDL-134 .     HDL is 34.  Trig - 255  Current Outpatient Prescriptions on File Prior to Visit  Medication Sig Dispense Refill  . alfuzosin (UROXATRAL) 10 MG 24 hr tablet Take 10 mg by mouth daily.        Marland Kitchen allopurinol (ZYLOPRIM) 300 MG tablet Take 300 mg by mouth daily.        . Alpha-Lipoic Acid 600 MG CAPS Take 1 capsule by mouth 2 (two) times daily.        . benazepril (LOTENSIN) 10 MG tablet Take 10 mg by mouth daily.        . Biotin (BIOTIN 5000) 5 MG CAPS Take 1 capsule by mouth 3 (three) times daily.        . carisoprodol (SOMA) 350 MG tablet Take 350 mg by mouth as needed.        Dewayne Shorter Seed OIL 600 mg 3 (three) times daily.        . Cinnamon 500 MG TABS Take 500 mg by mouth 3 (three) times daily.        . Coenzyme Q10 (COQ-10 PO) Take 1 Can by mouth 2 (two) times daily.        . digoxin (LANOXIN) 0.25 MG tablet Take 250 mcg by mouth daily.        Marland Kitchen docusate sodium (COLACE) 100 MG capsule Take 100 mg by mouth as needed.        . Evening Primrose Oil 1000 MG CAPS Take 1 capsule by mouth 4 (four) times daily.        . fenofibrate (TRICOR) 145 MG tablet Take 145 mg by mouth daily.        . finasteride (PROSCAR) 5 MG tablet Take 5 mg by mouth daily.        . Guaifenesin-Codeine (DIABETIC TUSSIN C PO) Take by mouth as needed.        Marland Kitchen levothyroxine (SYNTHROID, LEVOTHROID) 50 MCG tablet Take 50 mcg by mouth daily.        Marland Kitchen loratadine  (CLARITIN) 10 MG tablet Take 10 mg by mouth daily.        . metFORMIN (GLUCOPHAGE) 500 MG tablet Take 500 mg by mouth 2 (two) times daily with a meal.        . metoprolol tartrate (LOPRESSOR) 25 MG tablet Take 25 mg by mouth 2 (two) times daily.        . nitroGLYCERIN (NITROSTAT) 0.4 MG SL tablet Place 0.4 mg under the tongue every 5 (five) minutes as needed.        . propranolol (INDERAL) 10 MG tablet Take 10 mg by mouth as needed.        . rosuvastatin (CRESTOR) 10 MG tablet Take 1 tablet (10 mg total) by mouth at bedtime.  90 tablet  1  .  Thiamine HCl (VITAMIN B-1) 50 MG tablet Take 50 mg by mouth daily.        . vitamin E 400 UNIT capsule Take 400 Units by mouth daily.        Marland Kitchen DISCONTD: aspirin 325 MG tablet Take 325 mg by mouth daily.        Marland Kitchen DISCONTD: Digestive Enzymes (ENZYME DIGEST PO) Take 1 Can by mouth 3 (three) times daily.          No Known Allergies  Past Medical History  Diagnosis Date  . Coronary artery disease     post CABG  . Hyperlipidemia   . Diabetes mellitus   . Hypertension   . Atrial fibrillation   . Hypothyroidism   . Benign prostatic hypertrophy   . Gout   . Dizziness   . Anemia   . History of GI bleed   . History of angina pectoris     hypertensive   . Hiatal hernia   . Osteoarthritis     Past Surgical History  Procedure Date  . Hernia repair     x 3  . Bowel resection   . Tonsillectomy and adenoidectomy   . Coronary angioplasty with stent placement 05/1997  . Cardiac catheterization 12/21/2003    ejection fraction of approximately 65%  . Coronary artery bypass graft 01/21/2004     x6 using a left internal mammary  artery graft to the LAD coronary artery, with a saphenous vein graft to the second diagonal branch of the LAD coronary artery  with a sequential saphenous vein graft to the first diagonal branch of the LAD & then the obtuse  marginal coronary artery and the posterolateral branch of the RCA &  saphenous vein graft to the  posterior  descending coronary artery   . Retinal detachment surgery     History  Smoking status  . Former Smoker -- 1.0 packs/day for 55 years  . Types: Cigarettes  . Quit date: 08/29/1995  Smokeless tobacco  . Not on file    History  Alcohol Use  . Yes    history of alcohol abuse    Family History  Problem Relation Age of Onset  . Heart attack Father   . Coronary artery disease Father   . Hypertension Mother   . Cancer Mother   . Obesity Child     Reviw of Systems:  Reviewed in the HPI.  All other systems are negative.  Physical Exam: BP 134/64  Pulse 78  Ht 6\' 2"  (1.88 m)  Wt 193 lb 6.4 oz (87.726 kg)  BMI 24.83 kg/m2 The patient is alert and oriented x 3.  The mood and affect are normal.   Skin: warm and dry.  Color is normal.    HEENT:   the sclera are nonicteric.  The mucous membranes are moist.  The carotids are 2+ without bruits.  There is no thyromegaly.  There is no JVD.    Lungs: clear.  The chest wall is non tender.    Heart: regular rate with a normal S1 and S2.  There are no murmurs, gallops, or rubs. The PMI is not displaced.     Abdomin: good bowel sounds.  There is no guarding or rebound.  There is no hepatosplenomegaly or tenderness.  There are no masses.   Extremities:  Trace edema.  The legs are without rashes.  The distal pulses are intact.   Neuro:  Cranial nerves II - XII are intact.  Motor and  sensory functions are intact.    The gait is normal.  Assessment / Plan:

## 2011-03-28 ENCOUNTER — Telehealth: Payer: Self-pay | Admitting: Cardiovascular Disease

## 2011-03-28 NOTE — Telephone Encounter (Signed)
Pt called about his bill from last December He said a collection agency has contacted him. He was upset and said that [facility] would contact [facility] about his account and refiling the claim. He would call back on Friday to see if issue has been resolved

## 2011-04-03 NOTE — Telephone Encounter (Signed)
Spoke to patient on Friday 03/31/2011.  Patient and Pro fee billing notified.

## 2011-08-24 ENCOUNTER — Other Ambulatory Visit: Payer: Self-pay | Admitting: *Deleted

## 2011-08-24 MED ORDER — BENAZEPRIL HCL 10 MG PO TABS: 10.0000 mg | ORAL_TABLET | Freq: Every day | ORAL | Status: DC

## 2011-09-13 ENCOUNTER — Ambulatory Visit (INDEPENDENT_AMBULATORY_CARE_PROVIDER_SITE_OTHER): Payer: Medicare Other | Admitting: Cardiovascular Disease

## 2011-09-13 ENCOUNTER — Encounter: Payer: Self-pay | Admitting: Cardiovascular Disease

## 2011-09-13 DIAGNOSIS — E785 Hyperlipidemia, unspecified: Secondary | ICD-10-CM

## 2011-09-13 DIAGNOSIS — I2510 Atherosclerotic heart disease of native coronary artery without angina pectoris: Secondary | ICD-10-CM

## 2011-09-13 DIAGNOSIS — I4891 Unspecified atrial fibrillation: Secondary | ICD-10-CM

## 2011-09-13 MED ORDER — ROSUVASTATIN CALCIUM 10 MG PO TABS: 10.0000 mg | ORAL_TABLET | Freq: Every day | ORAL | Status: DC

## 2011-09-13 MED ORDER — FENOFIBRATE 145 MG PO TABS: 145.0000 mg | ORAL_TABLET | Freq: Every day | ORAL | Status: AC

## 2011-09-13 NOTE — Progress Notes (Signed)
William Chambers Date of Birth  1923-01-07 [facility]     [place] Office  [address]    [address]   [address] [place], [place]  [address]    [place], [place]  [address] [phone]  Fax  [phone]  [phone]  Fax [phone]   History of Present Illness:  William Chambers is an 78 yo with a hx of CAD /CABG.  Hx of A-fib, hyperlipidemia, Diabetes mellitus.  He has not had any chest pain.  He has had some some dyspnea.  He does not exercise as much as he would like to  Lab work from his medical doctor reveals a persistently elevated Triglyceride level.    His last Trig level is 273  Current Outpatient Prescriptions on File Prior to Visit  Medication Sig Dispense Refill  . alfuzosin (UROXATRAL) 10 MG 24 hr tablet Take 10 mg by mouth daily.        Marland Kitchen allopurinol (ZYLOPRIM) 300 MG tablet Take 300 mg by mouth daily.        . Alpha-Lipoic Acid 600 MG CAPS Take 1 capsule by mouth 2 (two) times daily.        Marland Kitchen aspirin 81 MG tablet Take 81 mg by mouth daily.        Marland Kitchen azelastine (ASTELIN) 137 MCG/SPRAY nasal spray Place 1 spray into the nose daily. Use in each nostril as directed       . benazepril (LOTENSIN) 10 MG tablet Take 1 tablet (10 mg total) by mouth daily.  30 tablet  6  . Biotin (BIOTIN 5000) 5 MG CAPS Take 1 capsule by mouth 3 (three) times daily.        . carisoprodol (SOMA) 350 MG tablet Take 350 mg by mouth as needed.        William Chambers Seed OIL 600 mg 3 (three) times daily.        . Cinnamon 500 MG TABS Take 500 mg by mouth 3 (three) times daily.        . Coenzyme Q10 (COQ-10 PO) Take 1 Can by mouth 2 (two) times daily.        Marland Kitchen DIGESTIVE ENZYMES PO Take by mouth 3 (three) times daily.        . digoxin (LANOXIN) 0.25 MG tablet Take 250 mcg by mouth daily.        Marland Kitchen docusate sodium (COLACE) 100 MG capsule Take 100 mg by mouth as needed.        . Evening Primrose Oil 1000 MG CAPS Take 1 capsule by mouth 4 (four) times daily.        . fenofibrate (TRICOR) 145 MG  tablet Take 145 mg by mouth daily.        . finasteride (PROSCAR) 5 MG tablet Take 5 mg by mouth daily.        . Guaifenesin-Codeine (DIABETIC TUSSIN C PO) Take by mouth as needed.        Marland Kitchen levothyroxine (SYNTHROID, LEVOTHROID) 50 MCG tablet Take 50 mcg by mouth daily.        Marland Kitchen loratadine (CLARITIN) 10 MG tablet Take 10 mg by mouth daily.        . metFORMIN (GLUCOPHAGE) 500 MG tablet Take 500 mg by mouth 2 (two) times daily with a meal.        . metoprolol tartrate (LOPRESSOR) 25 MG tablet Take 25 mg by mouth 2 (two) times daily.        . nitroGLYCERIN (  NITROSTAT) 0.4 MG SL tablet Place 0.4 mg under the tongue every 5 (five) minutes as needed.        . polyethylene glycol (MIRALAX / GLYCOLAX) packet Take 17 g by mouth daily.        . propranolol (INDERAL) 10 MG tablet Take 10 mg by mouth as needed.        . rosuvastatin (CRESTOR) 10 MG tablet Take 1 tablet (10 mg total) by mouth at bedtime.  90 tablet  3  . Thiamine HCl (VITAMIN B-1) 50 MG tablet Take 50 mg by mouth daily.        . vitamin E 400 UNIT capsule Take 400 Units by mouth daily.          No Known Allergies  Past Medical History  Diagnosis Date  . Coronary artery disease     post CABG  . Hyperlipidemia   . Diabetes mellitus   . Hypertension   . Atrial fibrillation   . Hypothyroidism   . Benign prostatic hypertrophy   . Gout   . Dizziness   . Anemia   . History of GI bleed   . History of angina pectoris     hypertensive   . Hiatal hernia   . Osteoarthritis     Past Surgical History  Procedure Date  . Hernia repair     x 3  . Bowel resection   . Tonsillectomy and adenoidectomy   . Coronary angioplasty with stent placement 05/1997  . Cardiac catheterization 12/21/2003    ejection fraction of approximately 65%  . Coronary artery bypass graft 01/21/2004     x6 using a left internal mammary  artery graft to the LAD coronary artery, with a saphenous vein graft to the second diagonal branch of the LAD coronary artery   with a sequential saphenous vein graft to the first diagonal branch of the LAD & then the obtuse  marginal coronary artery and the posterolateral branch of the RCA &  saphenous vein graft to the  posterior descending coronary artery   . Retinal detachment surgery     History  Smoking status  . Former Smoker -- 1.0 packs/day for 55 years  . Types: Cigarettes  . Quit date: 08/29/1995  Smokeless tobacco  . Not on file    History  Alcohol Use  . Yes    history of alcohol abuse    Family History  Problem Relation Age of Onset  . Heart attack Father   . Coronary artery disease Father   . Hypertension Mother   . Cancer Mother   . Obesity Child     Reviw of Systems:  Reviewed in the HPI.  All other systems are negative.  Physical Exam: BP 137/64  Pulse 52  Ht 6\' 2"  (1.88 m)  Wt 193 lb 1.9 oz (87.599 kg)  BMI 24.80 kg/m2 The patient is alert and oriented x 3.  The mood and affect are normal.   Skin: warm and dry.  Color is normal.    HEENT:   [place]/at, normal carotids, no JVD, neck is supple  Lungs: clear   Heart: Irreg. Irreg. Normal S1, S2    Abdomen: soft, good BS, no HSM  Extremities:  No c/c/e  Neuro:  nonfocal    ECG: A-Fib with a controlled rate  Assessment / Plan:

## 2011-09-13 NOTE — Assessment & Plan Note (Signed)
His lipid levels remain a  challenge. His triglyceride levels have been quite elevated. He'll continue with the Crestor and TriCor. He also has added omega-3 fatty acids. We'll recheck his fasting lipid profile in 6 months at his next office visit.

## 2011-09-13 NOTE — Patient Instructions (Signed)
Your physician wants you to follow-up in: 6 MONTHS You will receive a reminder letter in the mail two months in advance. If you don't receive a letter, please call our office to schedule the follow-up appointment.  Your physician recommends that you return for a FASTING lipid profile: 6 MONTHS    

## 2011-09-13 NOTE — Assessment & Plan Note (Signed)
His atrial fibrillation is very stable. His rate is well controlled.

## 2011-11-21 ENCOUNTER — Other Ambulatory Visit: Payer: Self-pay | Admitting: Cardiovascular Disease

## 2012-03-25 ENCOUNTER — Other Ambulatory Visit: Payer: Self-pay | Admitting: Cardiovascular Disease

## 2012-03-25 NOTE — Telephone Encounter (Signed)
Pt needs appointment then refill can be made Fax Received. Refill Completed. William Chambers (R.M.A)   

## 2012-04-24 ENCOUNTER — Other Ambulatory Visit: Payer: Self-pay | Admitting: Cardiovascular Disease

## 2012-04-25 NOTE — Telephone Encounter (Signed)
Fax Received. Refill Completed. William Chambers (R.M.A)   

## 2012-06-20 ENCOUNTER — Other Ambulatory Visit: Payer: Self-pay | Admitting: Cardiovascular Disease

## 2012-08-13 ENCOUNTER — Telehealth: Payer: Self-pay | Admitting: Cardiovascular Disease

## 2012-08-13 NOTE — Telephone Encounter (Signed)
Please advise if he can take symbacort with his cardiac hx.

## 2012-08-13 NOTE — Telephone Encounter (Signed)
New Problem:    Patient called in because his PCP wanted him to start taking symbacort and wanted to know if that would be ok.  Please call back and feel free to leave a message.

## 2012-08-13 NOTE — Telephone Encounter (Signed)
Yes, he can take symbicort.

## 2012-08-13 NOTE — Telephone Encounter (Signed)
Pt informed

## 2012-08-16 ENCOUNTER — Other Ambulatory Visit: Payer: Self-pay | Admitting: Cardiovascular Disease

## 2012-08-16 MED ORDER — BENAZEPRIL HCL 10 MG PO TABS: 10.0000 mg | ORAL_TABLET | Freq: Every day | ORAL | Status: DC

## 2012-09-13 ENCOUNTER — Ambulatory Visit (INDEPENDENT_AMBULATORY_CARE_PROVIDER_SITE_OTHER): Payer: Medicare Other | Admitting: Cardiovascular Disease

## 2012-09-13 ENCOUNTER — Encounter: Payer: Self-pay | Admitting: Cardiovascular Disease

## 2012-09-13 VITALS — BP 120/56 | HR 52 | Ht 73.5 in | Wt 194.0 lb

## 2012-09-13 DIAGNOSIS — I2510 Atherosclerotic heart disease of native coronary artery without angina pectoris: Secondary | ICD-10-CM

## 2012-09-13 MED ORDER — PROPRANOLOL HCL 10 MG PO TABS: 10.0000 mg | ORAL_TABLET | ORAL | Status: DC | PRN

## 2012-09-13 MED ORDER — ROSUVASTATIN CALCIUM 10 MG PO TABS: 10.0000 mg | ORAL_TABLET | Freq: Every day | ORAL | Status: DC

## 2012-09-13 MED ORDER — DIGOXIN 250 MCG PO TABS: 0.2500 mg | ORAL_TABLET | Freq: Every day | ORAL | Status: DC

## 2012-09-13 MED ORDER — BUDESONIDE-FORMOTEROL FUMARATE 80-4.5 MCG/ACT IN AERO: 2.0000 | INHALATION_SPRAY | Freq: Two times a day (BID) | RESPIRATORY_TRACT | Status: DC

## 2012-09-13 MED ORDER — BENAZEPRIL HCL 10 MG PO TABS: 10.0000 mg | ORAL_TABLET | Freq: Every day | ORAL | Status: DC

## 2012-09-13 NOTE — Progress Notes (Signed)
William Chambers Date of Birth  11/09/1922 [facility]     [place] Office  [address]    [address]   [address] [place], [place]  [address]    [place], [place]  [address] [phone]  Fax  [phone]  [phone]  Fax [phone]  Problems 1. CAD, CABG 2. Paroxysmal Atrial fibrillation 3. Diabetes mellitus 4. Hyperlipidemia   History of Present Illness:  William Chambers is an 79 yo with a hx of CAD /CABG.  Hx of A-fib, hyperlipidemia, Diabetes mellitus.  He has not had any chest pain.  He has had some some dyspnea.  He remains active - he enjoys Chiropodist .   Lab work from his medical doctor reveals a persistently elevated Triglyceride level.    His last Trig level is 211.    September 13, 2012:  He has been seeing a pulmonologist for bronchial problems.  His HBA1C is a bit higher due to eating "well" He has been walking some without problems.  He enjoys going Geo-caching.      Current Outpatient Prescriptions on File Prior to Visit  Medication Sig Dispense Refill  . acetaminophen (TYLENOL) 650 MG CR tablet Take 650 mg by mouth every 8 (eight) hours as needed.      Marland Kitchen alfuzosin (UROXATRAL) 10 MG 24 hr tablet Take 10 mg by mouth daily.        Marland Kitchen allopurinol (ZYLOPRIM) 300 MG tablet Take 300 mg by mouth daily.        . Alpha-Lipoic Acid 600 MG CAPS Take 1 capsule by mouth 2 (two) times daily.        Marland Kitchen aspirin 81 MG tablet Take 81 mg by mouth daily.        Marland Kitchen azelastine (ASTELIN) 137 MCG/SPRAY nasal spray Place 1 spray into the nose daily. Use in each nostril as directed       . benazepril (LOTENSIN) 10 MG tablet Take 1 tablet (10 mg total) by mouth daily.  30 tablet  5  . Biotin (BIOTIN 5000) 5 MG CAPS Take 1 capsule by mouth 3 (three) times daily.        . carisoprodol (SOMA) 350 MG tablet Take 350 mg by mouth as needed.        Dewayne Shorter Seed OIL 600 mg 3 (three) times daily.        . Cinnamon 500 MG TABS Take 500 mg by mouth 3 (three) times daily.        .  Coenzyme Q10 (COQ-10 PO) Take 1 Can by mouth 2 (two) times daily.        Marland Kitchen DIGESTIVE ENZYMES PO Take by mouth 3 (three) times daily.        . digoxin (LANOXIN) 0.25 MG tablet TAKE ONE TABLET BY MOUTH EVERY DAY  30 tablet  5  . docusate sodium (COLACE) 100 MG capsule Take 100 mg by mouth as needed.        . Evening Primrose Oil 1000 MG CAPS Take 1 capsule by mouth 4 (four) times daily.        . fenofibrate (TRICOR) 145 MG tablet Take 1 tablet (145 mg total) by mouth daily.  90 tablet  3  . finasteride (PROSCAR) 5 MG tablet Take 5 mg by mouth daily.        . Guaifenesin-Codeine (DIABETIC TUSSIN C PO) Take by mouth as needed.        Marland Kitchen ipratropium (ATROVENT) 0.06 % nasal spray USE 2  PUFF TWICE A DAY      . levothyroxine (SYNTHROID, LEVOTHROID) 50 MCG tablet Take 50 mcg by mouth daily.        Marland Kitchen loratadine (CLARITIN) 10 MG tablet Take 10 mg by mouth daily.        . metFORMIN (GLUCOPHAGE) 500 MG tablet Take 500 mg by mouth 2 (two) times daily with a meal.        . metoprolol tartrate (LOPRESSOR) 25 MG tablet Take 25 mg by mouth 2 (two) times daily.        . nitroGLYCERIN (NITROSTAT) 0.4 MG SL tablet Place 0.4 mg under the tongue every 5 (five) minutes as needed.        Marland Kitchen omeprazole (PRILOSEC) 40 MG capsule TAKE ONE TABLET DAILY      . polyethylene glycol (MIRALAX / GLYCOLAX) packet Take 17 g by mouth daily.        . propranolol (INDERAL) 10 MG tablet Take 10 mg by mouth as needed.        . rosuvastatin (CRESTOR) 10 MG tablet Take 10 mg by mouth at bedtime.      . Thiamine HCl (VITAMIN B-1) 50 MG tablet Take 50 mg by mouth daily.        . vitamin E 400 UNIT capsule Take 400 Units by mouth daily.          No Known Allergies  Past Medical History  Diagnosis Date  . Coronary artery disease     post CABG  . Hyperlipidemia   . Diabetes mellitus   . Hypertension   . Atrial fibrillation   . Hypothyroidism   . Benign prostatic hypertrophy   . Gout   . Dizziness   . Anemia   . History of GI bleed    . History of angina pectoris     hypertensive   . Hiatal hernia   . Osteoarthritis     Past Surgical History  Procedure Date  . Hernia repair     x 3  . Bowel resection   . Tonsillectomy and adenoidectomy   . Coronary angioplasty with stent placement 05/1997  . Cardiac catheterization 12/21/2003    ejection fraction of approximately 65%  . Coronary artery bypass graft 01/21/2004     x6 using a left internal mammary  artery graft to the LAD coronary artery, with a saphenous vein graft to the second diagonal branch of the LAD coronary artery  with a sequential saphenous vein graft to the first diagonal branch of the LAD & then the obtuse  marginal coronary artery and the posterolateral branch of the RCA &  saphenous vein graft to the  posterior descending coronary artery   . Retinal detachment surgery     History  Smoking status  . Former Smoker -- 1.0 packs/day for 55 years  . Types: Cigarettes  . Quit date: 08/29/1995  Smokeless tobacco  . Not on file    History  Alcohol Use  . Yes    Comment: history of alcohol abuse    Family History  Problem Relation Age of Onset  . Heart attack Father   . Coronary artery disease Father   . Hypertension Mother   . Cancer Mother   . Obesity Child     Reviw of Systems:  Reviewed in the HPI.  All other systems are negative.  Physical Exam: BP 120/56  Pulse 52  Ht 6' 1.5" (1.867 m)  Wt 194 lb (87.998 kg)  BMI 25.25 kg/m2  SpO2 95% The patient is alert and oriented x 3.  The mood and affect are normal.   Skin: warm and dry.  Color is normal.    HEENT:   [place]/at, normal carotids, no JVD, neck is supple  Lungs: clear   Heart: Irreg. Irreg. Normal S1, S2    Abdomen: soft, good BS, no HSM  Extremities:  No c/c/e  Neuro:  nonfocal    ECG: Jan. 17 ,2014:  Sinus bradycardia at 52 beats a minute. He has left ventricular hypertrophy with repolarization velocities.  Assessment / Plan:

## 2012-09-13 NOTE — Assessment & Plan Note (Addendum)
He denies any episodes of angina. At his current age of 60, I would have a rectal conservator approach and I would not anticipate doing a stress test. He has a chronic cough. Her last echocardiogram is approximately 77 years old.  I told him that we could do an echocardiogram if his pulmonologist is concerned that his symptoms are due to congestive heart failure.  He did not want to schedule one at this point.  I will see him again in one year for followup visit. We'll check fasting labs at that time.

## 2012-09-13 NOTE — Patient Instructions (Addendum)
Your physician wants you to follow-up in: 1 Year You will receive a reminder letter in the mail two months in advance. If you don't receive a letter, please call our office to schedule the follow-up appointment.  Your physician recommends that you continue on your current medications as directed. Please refer to the Current Medication list given to you today.   

## 2013-09-11 ENCOUNTER — Ambulatory Visit: Payer: Medicare Other | Admitting: Cardiovascular Disease

## 2013-10-02 ENCOUNTER — Encounter: Payer: Self-pay | Admitting: Cardiovascular Disease

## 2013-10-02 ENCOUNTER — Other Ambulatory Visit: Payer: Self-pay | Admitting: Cardiovascular Disease

## 2013-10-02 ENCOUNTER — Ambulatory Visit (INDEPENDENT_AMBULATORY_CARE_PROVIDER_SITE_OTHER): Payer: Medicare Other | Admitting: Cardiovascular Disease

## 2013-10-02 VITALS — BP 124/58 | HR 68 | Ht 73.5 in | Wt 190.8 lb

## 2013-10-02 DIAGNOSIS — I2510 Atherosclerotic heart disease of native coronary artery without angina pectoris: Secondary | ICD-10-CM

## 2013-10-02 DIAGNOSIS — E785 Hyperlipidemia, unspecified: Secondary | ICD-10-CM

## 2013-10-02 DIAGNOSIS — I4891 Unspecified atrial fibrillation: Secondary | ICD-10-CM

## 2013-10-02 MED ORDER — BENAZEPRIL HCL 10 MG PO TABS: 10.0000 mg | ORAL_TABLET | Freq: Every day | ORAL | Status: DC

## 2013-10-02 MED ORDER — DIGOXIN 125 MCG PO TABS: 0.1250 mg | ORAL_TABLET | Freq: Every day | ORAL | Status: DC

## 2013-10-02 MED ORDER — PROPRANOLOL HCL 10 MG PO TABS: 10.0000 mg | ORAL_TABLET | ORAL | Status: DC | PRN

## 2013-10-02 NOTE — Assessment & Plan Note (Signed)
He has not had any recurrent atrial fibrillation in quite some time. As I remember, it was associated with a respiratory infection.  He has become somewhat unsteady on his feet. He has frequent episodes of loss of balance and equate it to  walking on a moving ship. At this point we will hold off starting any anticoagulation until we see more atrial fibrillation. At present I don't think he has  a significant atrial fibrillation burden.

## 2013-10-02 NOTE — Progress Notes (Addendum)
Bevelyn NgoJohn L Vessel Date of Birth  09/03/1922 [facility]     [place] Office  [address]    [address]   [address] [place], [place]  [address]    [place], [place]  [id number] [phone]  Fax  [phone]  [phone]  Fax [phone]  Problems 1. CAD, CABG 2. Paroxysmal Atrial fibrillation 3. Diabetes mellitus 4. Hyperlipidemia   History of Present Illness:  Jonny RuizJohn is an 80 yo with a hx of CAD /CABG.  Hx of A-fib, hyperlipidemia, Diabetes mellitus.  He has not had any chest pain.  He has had some some dyspnea.  He remains active - he enjoys ChiropodistGeocaching .   Lab work from his medical doctor reveals a persistently elevated Triglyceride level.    His last Trig level is 211.    September 13, 2012:  He has been seeing a pulmonologist for bronchial problems.  His HBA1C is a bit higher due to eating "well" He has been walking some without problems.  He enjoys going Geo-caching.    Feb. 5, 2015:  Jonny RuizJohn has done well.  He has started insulin since I last saw him.    No angina.    He has seen a pulmonologist and has been diagnosed with COPD.   He still eats out at least once a day.  He still sprinkles salt on some foods.    He has some unsteadiness when he walks some times.  He has stopped his crestor but his cholesterol levels increased.  His medical doctor placed him on Atorvastatin at that time.   Current Outpatient Prescriptions on File Prior to Visit  Medication Sig Dispense Refill  . acetaminophen (TYLENOL) 650 MG CR tablet Take 650 mg by mouth every 8 (eight) hours as needed.      Marland Kitchen. alfuzosin (UROXATRAL) 10 MG 24 hr tablet Take 10 mg by mouth daily.        Marland Kitchen. allopurinol (ZYLOPRIM) 300 MG tablet Take 300 mg by mouth daily.        . Alpha-Lipoic Acid 600 MG CAPS Take 1 capsule by mouth 2 (two) times daily.        Marland Kitchen. aspirin 81 MG tablet Take 81 mg by mouth daily.        Marland Kitchen. azelastine (ASTELIN) 137 MCG/SPRAY nasal spray Place 1 spray into the nose daily. Use in each  nostril as directed       . benazepril (LOTENSIN) 10 MG tablet Take 1 tablet (10 mg total) by mouth daily.  30 tablet  11  . Biotin (BIOTIN 5000) 5 MG CAPS Take 1 capsule by mouth 3 (three) times daily.        . budesonide-formoterol (SYMBICORT) 80-4.5 MCG/ACT inhaler Inhale 2 puffs into the lungs 2 (two) times daily.  1 Inhaler  12  . carisoprodol (SOMA) 350 MG tablet Take 350 mg by mouth as needed.        . Coenzyme Q10 (COQ-10 PO) Take 1 Can by mouth 2 (two) times daily.        Marland Kitchen. DIGESTIVE ENZYMES PO Take by mouth 3 (three) times daily.        . digoxin (LANOXIN) 0.25 MG tablet Take 1 tablet (0.25 mg total) by mouth daily.  30 tablet  11  . docusate sodium (COLACE) 100 MG capsule Take 100 mg by mouth as needed.        . Evening Primrose Oil 1000 MG CAPS Take 1 capsule by mouth 4 (four)  times daily.        . fenofibrate (TRICOR) 145 MG tablet Take 1 tablet (145 mg total) by mouth daily.  90 tablet  3  . finasteride (PROSCAR) 5 MG tablet Take 5 mg by mouth daily.        Marland Kitchen ipratropium (ATROVENT) 0.06 % nasal spray USE 2 PUFF TWICE A DAY      . levothyroxine (SYNTHROID, LEVOTHROID) 50 MCG tablet Take 50 mcg by mouth daily.        . metFORMIN (GLUCOPHAGE) 500 MG tablet Take 500 mg by mouth 2 (two) times daily with a meal.        . metoprolol tartrate (LOPRESSOR) 25 MG tablet Take 25 mg by mouth 2 (two) times daily.        . nitroGLYCERIN (NITROSTAT) 0.4 MG SL tablet Place 0.4 mg under the tongue every 5 (five) minutes as needed.        Marland Kitchen omeprazole (PRILOSEC) 40 MG capsule TAKE ONE TABLET DAILY      . polyethylene glycol (MIRALAX / GLYCOLAX) packet Take 17 g by mouth daily.        . Thiamine HCl (VITAMIN B-1) 50 MG tablet Take 50 mg by mouth daily.         No current facility-administered medications on file prior to visit.    No Known Allergies  Past Medical History  Diagnosis Date  . Coronary artery disease     post CABG  . Hyperlipidemia   . Diabetes mellitus   . Hypertension   .  Atrial fibrillation   . Hypothyroidism   . Benign prostatic hypertrophy   . Gout   . Dizziness   . Anemia   . History of GI bleed   . History of angina pectoris     hypertensive   . Hiatal hernia   . Osteoarthritis     Past Surgical History  Procedure Laterality Date  . Hernia repair      x 3  . Bowel resection    . Tonsillectomy and adenoidectomy    . Coronary angioplasty with stent placement  05/1997  . Cardiac catheterization  12/21/2003    ejection fraction of approximately 65%  . Coronary artery bypass graft  01/21/2004     x6 using a left internal mammary  artery graft to the LAD coronary artery, with a saphenous vein graft to the second diagonal branch of the LAD coronary artery  with a sequential saphenous vein graft to the first diagonal branch of the LAD & then the obtuse  marginal coronary artery and the posterolateral branch of the RCA &  saphenous vein graft to the  posterior descending coronary artery   . Retinal detachment surgery      History  Smoking status  . Former Smoker -- 1.00 packs/day for 55 years  . Types: Cigarettes  . Quit date: 08/29/1995  Smokeless tobacco  . Not on file    History  Alcohol Use  . Yes    Comment: history of alcohol abuse    Family History  Problem Relation Age of Onset  . Heart attack Father   . Coronary artery disease Father   . Hypertension Mother   . Cancer Mother   . Obesity Child     Reviw of Systems:  Reviewed in the HPI.  All other systems are negative.  Physical Exam: BP 124/58  Pulse 68  Ht 6' 1.5" (1.867 m)  Wt 190 lb 12.8 oz (86.546 kg)  BMI  24.83 kg/m2 The patient is alert and oriented x 3.  The mood and affect are normal.   Skin: warm and dry.  Color is normal.    HEENT:   [place]/at, normal carotids, no JVD, neck is supple  Lungs: clear   Heart: Irreg. Irreg. Normal S1, S2    Abdomen: soft, good BS, no HSM  Extremities:  No c/c/.  He has trace bilateral edema.   Neuro:  nonfocal     ECG: Jan. 17 ,2014:  Sinus bradycardia at 68 beats a minute. He has left ventricular hypertrophy with repolarization abnormalities.  Assessment / Plan:

## 2013-10-02 NOTE — Patient Instructions (Addendum)
Your physician wants you to follow-up in: 1 year You will receive a reminder letter in the mail two months in advance. If you don't receive a letter, please call our office to schedule the follow-up appointment.  Your physician has recommended you make the following change in your medication:  Reduce digoxin to 0.125 mg daily

## 2013-10-13 ENCOUNTER — Ambulatory Visit: Payer: Medicare Other | Admitting: Cardiovascular Disease

## 2014-02-04 ENCOUNTER — Encounter (HOSPITAL_COMMUNITY): Payer: Self-pay | Admitting: Pharmacy Technician

## 2014-02-10 NOTE — Patient Instructions (Addendum)
Your procedure is scheduled on: 02/17/14   Report to [facility] at 07:00 AM.  Call this number if you have problems the morning of surgery: [phone]   Remember:   Do not eat food or drink liquids after midnight.   Take these medicines the morning of surgery with A SIP OF WATER: Benazepril, Digoxin, Famotidine, Levothyroxine, Metoprolol and Propranlol. Use your Atorvent and Symbicort inhaler morning of surgery also. Take only half of your dose of Levemir insulin the night before your surgery.   Do not wear jewelry, make-up or nail polish.  Do not wear lotions, powders, or perfumes. You may wear deodorant.  Do not bring valuables to the hospital.  [facility] any belongings or valuables.               Contacts, dentures or bridgework may not be worn into surgery.               Patients discharged the day of surgery will not be allowed to drive home.   Special Instructions: Start using your eye drops prior to surgery as directed by your eye doctor.   Please read over the following fact sheets that you were given: Anesthesia Post-op Instructions and Care and Recovery After Surgery     Cataract Surgery  A cataract is a clouding of the lens of the eye. When a lens becomes cloudy, vision is reduced based on the degree and nature of the clouding. Surgery may be needed to improve vision. Surgery removes the cloudy lens and usually replaces it with a substitute lens (intraocular lens, IOL). LET YOUR EYE DOCTOR KNOW ABOUT:  Allergies to food or medicine.  Medicines taken including herbs, eyedrops, over-the-counter medicines, and creams.  Use of steroids (by mouth or creams).  Previous problems with anesthetics or numbing medicine.  History of bleeding problems or blood clots.  Previous surgery.  Other health problems, including diabetes and kidney problems.  Possibility of pregnancy, if this applies. RISKS AND COMPLICATIONS  Infection.  Inflammation of the  eyeball (endophthalmitis) that can spread to both eyes (sympathetic ophthalmia).  Poor wound healing.  If an IOL is inserted, it can later fall out of proper position. This is very uncommon.  Clouding of the part of your eye that holds an IOL in place. This is called an "after-cataract." These are uncommon, but easily treated. BEFORE THE PROCEDURE  Do not eat or drink anything except small amounts of water for 8 to 12 before your surgery, or as directed by your caregiver.  Unless you are told otherwise, continue any eyedrops you have been prescribed.  Talk to your primary caregiver about all other medicines that you take (both prescription and non-prescription). In some cases, you may need to stop or change medicines near the time of your surgery. This is most important if you are taking blood-thinning medicine.Do not stop medicines unless you are told to do so.  Arrange for someone to drive you to and from the procedure.  Do not put contact lenses in either eye on the day of your surgery. PROCEDURE There is more than one method for safely removing a cataract. Your doctor can explain the differences and help determine which is best for you. Phacoemulsification surgery is the most common form of cataract surgery.  An injection is given behind the eye or eyedrops are given to make this a painless procedure.  A small cut (incision) is made on the edge of the clear, dome-shaped surface  that covers the front of the eye (cornea).  A tiny probe is painlessly inserted into the eye. This device gives off ultrasound waves that soften and break up the cloudy center of the lens. This makes it easier for the cloudy lens to be removed by suction.  An IOL may be implanted.  The normal lens of the eye is covered by a clear capsule. Part of that capsule is intentionally left in the eye to support the IOL.  Your surgeon may or may not use stitches to close the incision. There are other forms of  cataract surgery that require a larger incision and stiches to close the eye. This approach is taken in cases where the doctor feels that the cataract cannot be easily removed using phacoemulsification. AFTER THE PROCEDURE  When an IOL is implanted, it does not need care. It becomes a permanent part of your eye and cannot be seen or felt.  Your doctor will schedule follow-up exams to check on your progress.  Review your other medicines with your doctor to see which can be resumed after surgery.  Use eyedrops or take medicine as prescribed by your doctor. Document Released: 08/03/2011 Document Revised: 11/06/2011 Document Reviewed: 08/03/2011 [employer] Patient Information 2013 [employer] [place].    PATIENT INSTRUCTIONS POST-ANESTHESIA  IMMEDIATELY FOLLOWING SURGERY:  Do not drive or operate machinery for the first twenty four hours after surgery.  Do not make any important decisions for twenty four hours after surgery or while taking narcotic pain medications or sedatives.  If you develop intractable nausea and vomiting or a severe headache please notify your doctor immediately.  FOLLOW-UP:  Please make an appointment with your surgeon as instructed. You do not need to follow up with anesthesia unless specifically instructed to do so.  WOUND CARE INSTRUCTIONS (if applicable):  Keep a dry clean dressing on the anesthesia/puncture wound site if there is drainage.  Once the wound has quit draining you may leave it open to air.  Generally you should leave the bandage intact for twenty four hours unless there is drainage.  If the epidural site drains for more than 36-48 hours please call the anesthesia department.  QUESTIONS?:  Please feel free to call your physician or the hospital operator if you have any questions, and they will be happy to assist you.

## 2014-02-12 ENCOUNTER — Encounter (HOSPITAL_COMMUNITY)
Admission: RE | Admit: 2014-02-12 | Discharge: 2014-02-12 | Disposition: A | Discharge status: Home / Self Care | Payer: Medicare Other | Source: Ambulatory Visit | Attending: Ophthalmology | Admitting: Ophthalmology

## 2014-02-12 ENCOUNTER — Encounter (HOSPITAL_COMMUNITY): Payer: Self-pay

## 2014-02-12 DIAGNOSIS — Z01812 Encounter for preprocedural laboratory examination: Secondary | ICD-10-CM | POA: Diagnosis not present

## 2014-02-12 DIAGNOSIS — Z01818 Encounter for other preprocedural examination: Secondary | ICD-10-CM | POA: Insufficient documentation

## 2014-02-12 HISTORY — DX: Acute myocardial infarction, unspecified: I21.9

## 2014-02-12 HISTORY — DX: Chronic obstructive pulmonary disease, unspecified: J44.9

## 2014-02-12 HISTORY — DX: Shortness of breath: R06.02

## 2014-02-12 LAB — BASIC METABOLIC PANEL
BUN: 36 mg/dL — ABNORMAL HIGH (ref 6–23)
CALCIUM: 10.1 mg/dL (ref 8.4–10.5)
CO2: 26 meq/L (ref 19–32)
CREATININE: 1.4 mg/dL — AB (ref 0.50–1.35)
Chloride: 100 mEq/L (ref 96–112)
GFR calc Af Amer: 50 mL/min — ABNORMAL LOW (ref >90)
GFR calc non Af Amer: 43 mL/min — ABNORMAL LOW (ref >90)
Glucose, Bld: 151 mg/dL — ABNORMAL HIGH (ref 70–99)
Potassium: 5.2 mEq/L (ref 3.7–5.3)
Sodium: 140 mEq/L (ref 137–147)

## 2014-02-12 LAB — HEMOGLOBIN AND HEMATOCRIT, BLOOD
HCT: 33.9 % — ABNORMAL LOW (ref 39.0–52.0)
Hemoglobin: 11.2 g/dL — ABNORMAL LOW (ref 13.0–17.0)

## 2014-02-12 NOTE — Pre-Procedure Instructions (Signed)
Patient given information to sign up for my chart at home. 

## 2014-02-17 ENCOUNTER — Ambulatory Visit (HOSPITAL_COMMUNITY)
Admission: RE | Admit: 2014-02-17 | Discharge: 2014-02-17 | Disposition: A | Discharge status: Home / Self Care | Payer: Medicare Other | Source: Ambulatory Visit | Attending: Ophthalmology | Admitting: Ophthalmology

## 2014-02-17 ENCOUNTER — Encounter (HOSPITAL_COMMUNITY): Payer: Medicare Other | Admitting: Anesthesiology

## 2014-02-17 ENCOUNTER — Ambulatory Visit (HOSPITAL_COMMUNITY): Payer: Medicare Other | Admitting: Anesthesiology

## 2014-02-17 ENCOUNTER — Encounter (HOSPITAL_COMMUNITY)
Admission: RE | Disposition: A | Discharge status: Home / Self Care | Payer: Self-pay | Source: Ambulatory Visit | Attending: Ophthalmology

## 2014-02-17 ENCOUNTER — Encounter (HOSPITAL_COMMUNITY): Payer: Self-pay | Admitting: *Deleted

## 2014-02-17 DIAGNOSIS — H2510 Age-related nuclear cataract, unspecified eye: Secondary | ICD-10-CM | POA: Insufficient documentation

## 2014-02-17 DIAGNOSIS — J4489 Other specified chronic obstructive pulmonary disease: Secondary | ICD-10-CM | POA: Insufficient documentation

## 2014-02-17 DIAGNOSIS — Z79899 Other long term (current) drug therapy: Secondary | ICD-10-CM | POA: Insufficient documentation

## 2014-02-17 DIAGNOSIS — J449 Chronic obstructive pulmonary disease, unspecified: Secondary | ICD-10-CM | POA: Insufficient documentation

## 2014-02-17 DIAGNOSIS — I10 Essential (primary) hypertension: Secondary | ICD-10-CM | POA: Insufficient documentation

## 2014-02-17 DIAGNOSIS — Z951 Presence of aortocoronary bypass graft: Secondary | ICD-10-CM | POA: Insufficient documentation

## 2014-02-17 DIAGNOSIS — E119 Type 2 diabetes mellitus without complications: Secondary | ICD-10-CM | POA: Insufficient documentation

## 2014-02-17 DIAGNOSIS — Z87891 Personal history of nicotine dependence: Secondary | ICD-10-CM | POA: Insufficient documentation

## 2014-02-17 HISTORY — PX: CATARACT EXTRACTION W/PHACO: SHX586

## 2014-02-17 SURGERY — PHACOEMULSIFICATION, CATARACT, WITH IOL INSERTION
Anesthesia: Monitor Anesthesia Care | Site: Eye | Laterality: Right

## 2014-02-17 MED ORDER — FENTANYL CITRATE 0.05 MG/ML IJ SOLN
25.0000 ug | INTRAMUSCULAR | Status: AC
  Administered 2014-02-17: 25 ug via INTRAVENOUS

## 2014-02-17 MED ORDER — TETRACAINE 0.5 % OP SOLN OPTIME - NO CHARGE
OPHTHALMIC | Status: DC | PRN
  Administered 2014-02-17: 2 [drp] via OPHTHALMIC

## 2014-02-17 MED ORDER — EPINEPHRINE HCL 1 MG/ML IJ SOLN
INTRAMUSCULAR | Status: AC
  Filled 2014-02-17: qty 1

## 2014-02-17 MED ORDER — PROVISC 10 MG/ML IO SOLN
INTRAOCULAR | Status: DC | PRN
  Administered 2014-02-17: 0.85 mL via INTRAOCULAR

## 2014-02-17 MED ORDER — EPINEPHRINE HCL 1 MG/ML IJ SOLN
INTRAOCULAR | Status: DC | PRN
  Administered 2014-02-17: 09:00:00

## 2014-02-17 MED ORDER — MIDAZOLAM HCL 2 MG/2ML IJ SOLN
1.0000 mg | INTRAMUSCULAR | Status: DC | PRN
  Administered 2014-02-17: 2 mg via INTRAVENOUS

## 2014-02-17 MED ORDER — TETRACAINE HCL 0.5 % OP SOLN
OPHTHALMIC | Status: AC
  Filled 2014-02-17: qty 2

## 2014-02-17 MED ORDER — KETOROLAC TROMETHAMINE 0.5 % OP SOLN
OPHTHALMIC | Status: AC
  Filled 2014-02-17: qty 5

## 2014-02-17 MED ORDER — FENTANYL CITRATE 0.05 MG/ML IJ SOLN
INTRAMUSCULAR | Status: AC
  Filled 2014-02-17: qty 2

## 2014-02-17 MED ORDER — LACTATED RINGERS IV SOLN
INTRAVENOUS | Status: DC
  Administered 2014-02-17: 08:00:00 via INTRAVENOUS

## 2014-02-17 MED ORDER — MIDAZOLAM HCL 2 MG/2ML IJ SOLN
INTRAMUSCULAR | Status: AC
  Filled 2014-02-17: qty 2

## 2014-02-17 MED ORDER — PHENYLEPHRINE HCL 2.5 % OP SOLN
OPHTHALMIC | Status: AC
  Filled 2014-02-17: qty 15

## 2014-02-17 MED ORDER — TETRACAINE HCL 0.5 % OP SOLN
1.0000 [drp] | OPHTHALMIC | Status: AC
  Administered 2014-02-17 (×3): 1 [drp] via OPHTHALMIC

## 2014-02-17 MED ORDER — CYCLOPENTOLATE-PHENYLEPHRINE OP SOLN OPTIME - NO CHARGE
OPHTHALMIC | Status: AC
  Filled 2014-02-17: qty 2

## 2014-02-17 MED ORDER — CYCLOPENTOLATE-PHENYLEPHRINE 0.2-1 % OP SOLN
1.0000 [drp] | OPHTHALMIC | Status: AC
  Administered 2014-02-17 (×3): 1 [drp] via OPHTHALMIC

## 2014-02-17 MED ORDER — BSS IO SOLN
INTRAOCULAR | Status: DC | PRN
  Administered 2014-02-17: 15 mL via INTRAOCULAR

## 2014-02-17 MED ORDER — PHENYLEPHRINE HCL 2.5 % OP SOLN
1.0000 [drp] | OPHTHALMIC | Status: AC
  Administered 2014-02-17 (×3): 1 [drp] via OPHTHALMIC

## 2014-02-17 MED ORDER — KETOROLAC TROMETHAMINE 0.5 % OP SOLN
1.0000 [drp] | OPHTHALMIC | Status: AC
  Administered 2014-02-17 (×3): 1 [drp] via OPHTHALMIC

## 2014-02-17 SURGICAL SUPPLY — 26 items
CAPSULAR TENSION RING-AMO (OPHTHALMIC RELATED) IMPLANT
CLOTH BEACON ORANGE TIMEOUT ST (SAFETY) ×3 IMPLANT
EYE SHIELD UNIVERSAL CLEAR (GAUZE/BANDAGES/DRESSINGS) ×3 IMPLANT
GLOVE BIO SURGEON STRL SZ 6.5 (GLOVE) IMPLANT
GLOVE BIO SURGEONS STRL SZ 6.5 (GLOVE)
GLOVE BIOGEL PI IND STRL 6.5 (GLOVE) ×1 IMPLANT
GLOVE BIOGEL PI INDICATOR 6.5 (GLOVE) ×2
GLOVE ECLIPSE 6.5 STRL STRAW (GLOVE) IMPLANT
GLOVE ECLIPSE 7.0 STRL STRAW (GLOVE) IMPLANT
GLOVE EXAM NITRILE LRG STRL (GLOVE) IMPLANT
GLOVE EXAM NITRILE MD LF STRL (GLOVE) IMPLANT
GLOVE SKINSENSE NS SZ6.5 (GLOVE)
GLOVE SKINSENSE STRL SZ6.5 (GLOVE) IMPLANT
HEALON 5 0.6 ML (INTRAOCULAR LENS) IMPLANT
KIT VITRECTOMY (OPHTHALMIC RELATED) IMPLANT
PAD ARMBOARD 7.5X6 YLW CONV (MISCELLANEOUS) ×3 IMPLANT
PROC W NO LENS (INTRAOCULAR LENS)
PROC W SPEC LENS (INTRAOCULAR LENS)
PROCESS W NO LENS (INTRAOCULAR LENS) IMPLANT
PROCESS W SPEC LENS (INTRAOCULAR LENS) IMPLANT
RING MALYGIN (MISCELLANEOUS) IMPLANT
SIGHTPATH CAT PROC W REG LENS (Ophthalmic Related) ×3 IMPLANT
TAPE SURG TRANSPORE 1 IN (GAUZE/BANDAGES/DRESSINGS) ×1 IMPLANT
TAPE SURGICAL TRANSPORE 1 IN (GAUZE/BANDAGES/DRESSINGS) ×2
VISCOELASTIC ADDITIONAL (OPHTHALMIC RELATED) IMPLANT
WATER STERILE IRR 250ML POUR (IV SOLUTION) ×3 IMPLANT

## 2014-02-17 NOTE — Anesthesia Postprocedure Evaluation (Signed)
  Anesthesia Post-op Note  Patient: William NgoJohn L Chambers  Procedure(s) Performed: Procedure(s) with comments: CATARACT EXTRACTION PHACO AND INTRAOCULAR LENS PLACEMENT (IOC) (Right) - CDE 10.81  Patient Location: Short Stay  Anesthesia Type:MAC  Level of Consciousness: awake, alert , oriented and patient cooperative  Airway and Oxygen Therapy: Patient Spontanous Breathing  Post-op Pain: none  Post-op Assessment: Post-op Vital signs reviewed, Patient's Cardiovascular Status Stable, Respiratory Function Stable, Patent Airway and Pain level controlled  Post-op Vital Signs: Reviewed and stable  Last Vitals:  Filed Vitals:   02/17/14 0810  BP: 144/66  Temp:   Resp: 39    Complications: No apparent anesthesia complications

## 2014-02-17 NOTE — H&P (Signed)
The patient was re examined and there is no change in the patients condition since the original H and P. 

## 2014-02-17 NOTE — Anesthesia Preprocedure Evaluation (Signed)
Anesthesia Evaluation  Patient identified by MRN, date of birth, ID band Patient awake    Reviewed: Allergy & Precautions, NPO status , Patient's Chart, lab work & pertinent test results, reviewed documented beta blocker date and time   Airway Mallampati: II TM Distance: >3 FB Neck ROM: Full    Dental  (+) Teeth Intact   Pulmonary shortness of breath and with exertion, COPDformer smoker,  breath sounds clear to auscultation        Cardiovascular hypertension, Pt. on medications and Pt. on home beta blockers + CAD, + Past MI and + CABG + dysrhythmias Atrial Fibrillation Rhythm:Regular Rate:Normal     Neuro/Psych    GI/Hepatic hiatal hernia,   Endo/Other  diabetes, Type 2, Oral Hypoglycemic AgentsHypothyroidism   Renal/GU      Musculoskeletal   Abdominal   Peds  Hematology   Anesthesia Other Findings   Reproductive/Obstetrics                           Anesthesia Physical Anesthesia Plan  ASA: IV  Anesthesia Plan: MAC   Post-op Pain Management:    Induction: Intravenous  Airway Management Planned: Nasal Cannula  Additional Equipment:   Intra-op Plan:   Post-operative Plan:   Informed Consent: I have reviewed the patients History and Physical, chart, labs and discussed the procedure including the risks, benefits and alternatives for the proposed anesthesia with the patient or authorized representative who has indicated his/her understanding and acceptance.     Plan Discussed with:   Anesthesia Plan Comments:         Anesthesia Quick Evaluation

## 2014-02-17 NOTE — Op Note (Signed)
Patient brought to the operating room and prepped and draped in the usual manner.  Lid speculum inserted in right eye.  Stab incision made at the twelve o'clock position.  Provisc instilled in the anterior chamber.   A 2.4 mm. Stab incision was made temporally.  An anterior capsulotomy was done with a bent 25 gauge needle.  The nucleus was hydrodissected.  The Phaco tip was inserted in the anterior chamber and the nucleus was emulsified.  CDE was 10.81.  During the remonal of the nucleus, there was a break in the posterior capsule.  Part of the nucleus went into the posterior cavity.  Remaining cortex was removed.  There was a good anterior capsulotomy.   The anterior chamber was deepened with Provisc.  A 14.0 Diopter Rayner 570C IOL was then inserted in the ciliary sulcus.  Provisc was then removed with the I and A tip.  The wound was then hydrated.  Patient sent to the Recovery Room in good condition with follow up in my office.  Preoperative Diagnosis:  Nuclear Cataract OD Postoperative Diagnosis:  Same Procedure name: Kelman Phacoemulsification with IOL

## 2014-02-17 NOTE — Discharge Instructions (Signed)
Cataract Surgery °Care After °Refer to this sheet in the next few weeks. These instructions provide you with information on caring for yourself after your procedure. Your caregiver may also give you more specific instructions. Your treatment has been planned according to current medical practices, but problems sometimes occur. Call your caregiver if you have any problems or questions after your procedure.  °HOME CARE INSTRUCTIONS  °· Avoid strenuous activities as directed by your caregiver. °· Ask your caregiver when you can resume driving. °· Use eyedrops or other medicines to help healing and control pressure inside your eye as directed by your caregiver. °· Only take over-the-counter or prescription medicines for pain, discomfort, or fever as directed by your caregiver. °· Do not to touch or rub your eyes. °· You may be instructed to use a protective shield during the first few days and nights after surgery. If not, wear sunglasses to protect your eyes. This is to protect the eye from pressure or from being accidentally bumped. °· Keep the area around your eye clean and dry. Avoid swimming or allowing water to hit you directly in the face while showering. Keep soap and shampoo out of your eyes. °· Do not bend or lift heavy objects. Bending increases pressure in the eye. You can walk, climb stairs, and do light household chores. °· Do not put a contact lens into the eye that had surgery until your caregiver says it is okay to do so. °· Ask your doctor when you can return to work. This will depend on the kind of work that you do. If you work in a dusty environment, you may be advised to wear protective eyewear for a period of time. °· Ask your caregiver when it will be safe to engage in sexual activity. °· Continue with your regular eye exams as directed by your caregiver. °What to expect: °· It is normal to feel itching and mild discomfort for a few days after cataract surgery. Some fluid discharge is also common,  and your eye may be sensitive to light and touch. °· After 1 to 2 days, even moderate discomfort should disappear. In most cases, healing will take about 6 weeks. °· If you received an intraocular lens (IOL), you may notice that colors are very bright or have a blue tinge. Also, if you have been in bright sunlight, everything may appear reddish for a few hours. If you see these color tinges, it is because your lens is clear and no longer cloudy. Within a few months after receiving an IOL, these extra colors should go away. When you have healed, you will probably need new glasses. °SEEK MEDICAL CARE IF:  °· You have increased bruising around your eye. °· You have discomfort not helped by medicine. °SEEK IMMEDIATE MEDICAL CARE IF:  °· You have a  fever. °· You have a worsening or sudden vision loss. °· You have redness, swelling, or increasing pain in the eye. °· You have a thick discharge from the eye that had surgery. °MAKE SURE YOU: °· Understand these instructions. °· Will watch your condition. °· Will get help right away if you are not doing well or get worse. °Document Released: 03/03/2005 Document Revised: 11/06/2011 Document Reviewed: 04/07/2011 °ExitCare® Patient Information ©2015 [employer], LLC. This information is not intended to replace advice given to you by your health care provider. Make sure you discuss any questions you have with your health care provider. ° °

## 2014-02-17 NOTE — Transfer of Care (Signed)
Immediate Anesthesia Transfer of Care Note  Patient: William NgoJohn L Chambers  Procedure(s) Performed: Procedure(s) with comments: CATARACT EXTRACTION PHACO AND INTRAOCULAR LENS PLACEMENT (IOC) (Right) - CDE 10.81  Patient Location: Short Stay  Anesthesia Type:MAC  Level of Consciousness: awake, alert , oriented and patient cooperative  Airway & Oxygen Therapy: Patient Spontanous Breathing  Post-op Assessment: Report given to PACU RN, Post -op Vital signs reviewed and stable and Patient moving all extremities  Post vital signs: Reviewed and stable  Complications: No apparent anesthesia complications

## 2014-02-18 ENCOUNTER — Encounter (HOSPITAL_COMMUNITY): Payer: Self-pay | Admitting: Ophthalmology

## 2014-02-23 ENCOUNTER — Encounter (HOSPITAL_COMMUNITY): Payer: Self-pay | Admitting: Pharmacy Technician

## 2014-03-04 ENCOUNTER — Other Ambulatory Visit (HOSPITAL_COMMUNITY): Payer: Medicare Other

## 2014-03-10 ENCOUNTER — Encounter (HOSPITAL_COMMUNITY): Payer: Self-pay

## 2014-03-10 ENCOUNTER — Ambulatory Visit (HOSPITAL_COMMUNITY): Admit: 2014-03-10 | Payer: Medicare Other | Admitting: Ophthalmology

## 2014-03-10 SURGERY — PHACOEMULSIFICATION, CATARACT, WITH IOL INSERTION
Anesthesia: Monitor Anesthesia Care | Laterality: Left

## 2014-04-30 ENCOUNTER — Encounter (HOSPITAL_COMMUNITY): Payer: Self-pay

## 2014-04-30 ENCOUNTER — Encounter (HOSPITAL_COMMUNITY)
Admission: RE | Admit: 2014-04-30 | Discharge: 2014-04-30 | Disposition: A | Discharge status: Home / Self Care | Payer: Medicare Other | Source: Ambulatory Visit | Attending: Ophthalmology | Admitting: Ophthalmology

## 2014-04-30 DIAGNOSIS — H2700 Aphakia, unspecified eye: Secondary | ICD-10-CM | POA: Insufficient documentation

## 2014-04-30 DIAGNOSIS — Z01818 Encounter for other preprocedural examination: Secondary | ICD-10-CM | POA: Diagnosis present

## 2014-04-30 LAB — BASIC METABOLIC PANEL
Anion gap: 13 (ref 5–15)
BUN: 24 mg/dL — ABNORMAL HIGH (ref 6–23)
CALCIUM: 10.4 mg/dL (ref 8.4–10.5)
CO2: 26 meq/L (ref 19–32)
Chloride: 101 mEq/L (ref 96–112)
Creatinine, Ser: 1.07 mg/dL (ref 0.50–1.35)
GFR calc Af Amer: 69 mL/min — ABNORMAL LOW (ref >90)
GFR calc non Af Amer: 59 mL/min — ABNORMAL LOW (ref >90)
Glucose, Bld: 173 mg/dL — ABNORMAL HIGH (ref 70–99)
POTASSIUM: 5.5 meq/L — AB (ref 3.7–5.3)
SODIUM: 140 meq/L (ref 137–147)

## 2014-04-30 LAB — HEMOGLOBIN AND HEMATOCRIT, BLOOD
HCT: 34 % — ABNORMAL LOW (ref 39.0–52.0)
Hemoglobin: 11.2 g/dL — ABNORMAL LOW (ref 13.0–17.0)

## 2014-04-30 MED ORDER — ONDANSETRON HCL 4 MG/2ML IJ SOLN: 4.0000 mg | Freq: Once | INTRAMUSCULAR | Status: AC | PRN

## 2014-04-30 MED ORDER — FENTANYL CITRATE 0.05 MG/ML IJ SOLN: 25.0000 ug | INTRAMUSCULAR | Status: DC | PRN

## 2014-04-30 NOTE — Patient Instructions (Signed)
    William Chambers  04/30/2014   Your procedure is scheduled on:   05/05/2014  Report to [facility] at  900 AM.  Call this number if you have problems the morning of surgery: [phone]   Remember:   Do not eat food or drink liquids after midnight.   Take these medicines the morning of surgery with A SIP OF WATER:  Alfuzosin, benazepril, digoxin, levothyroxine, metoprolol, inderal. Take 1/2 of your usual insulin dose the night before your surgery.   Do not wear jewelry, make-up or nail polish.  Do not wear lotions, powders, or perfumes.  Do not shave 48 hours prior to surgery. Men may shave face and neck.  Do not bring valuables to the hospital.  [facility] is not responsible for any belongings or valuables.               Contacts, dentures or bridgework may not be worn into surgery.  Leave suitcase in the car. After surgery it may be brought to your room.  For patients admitted to the hospital, discharge time is determined by your treatment team.               Patients discharged the day of surgery will not be allowed to drive home.  Name and phone number of your driver: family  Special Instructions: N/A   Please read over the following fact sheets that you were given: Pain Booklet, Coughing and Deep Breathing, Surgical Site Infection Prevention, Anesthesia Post-op Instructions and Care and Recovery After Surgery PATIENT INSTRUCTIONS POST-ANESTHESIA  IMMEDIATELY FOLLOWING SURGERY:  Do not drive or operate machinery for the first twenty four hours after surgery.  Do not make any important decisions for twenty four hours after surgery or while taking narcotic pain medications or sedatives.  If you develop intractable nausea and vomiting or a severe headache please notify your doctor immediately.  FOLLOW-UP:  Please make an appointment with your surgeon as instructed. You do not need to follow up with anesthesia unless specifically instructed to do so.  WOUND CARE INSTRUCTIONS (if  applicable):  Keep a dry clean dressing on the anesthesia/puncture wound site if there is drainage.  Once the wound has quit draining you may leave it open to air.  Generally you should leave the bandage intact for twenty four hours unless there is drainage.  If the epidural site drains for more than 36-48 hours please call the anesthesia department.  QUESTIONS?:  Please feel free to call your physician or the hospital operator if you have any questions, and they will be happy to assist you.

## 2014-04-30 NOTE — Pre-Procedure Instructions (Signed)
Patient given information to sign up for my chart at home. 

## 2014-05-05 ENCOUNTER — Ambulatory Visit (HOSPITAL_COMMUNITY)
Admission: RE | Admit: 2014-05-05 | Discharge: 2014-05-05 | Disposition: A | Discharge status: Home / Self Care | Payer: Medicare Other | Source: Ambulatory Visit | Attending: Ophthalmology | Admitting: Ophthalmology

## 2014-05-05 ENCOUNTER — Encounter (HOSPITAL_COMMUNITY)
Admission: RE | Disposition: A | Discharge status: Home / Self Care | Payer: Self-pay | Source: Ambulatory Visit | Attending: Ophthalmology

## 2014-05-05 ENCOUNTER — Ambulatory Visit (HOSPITAL_COMMUNITY): Payer: Medicare Other | Admitting: Anesthesiology

## 2014-05-05 ENCOUNTER — Encounter (HOSPITAL_COMMUNITY): Payer: Medicare Other | Admitting: Anesthesiology

## 2014-05-05 ENCOUNTER — Encounter (HOSPITAL_COMMUNITY): Payer: Self-pay | Admitting: *Deleted

## 2014-05-05 DIAGNOSIS — Z951 Presence of aortocoronary bypass graft: Secondary | ICD-10-CM | POA: Insufficient documentation

## 2014-05-05 DIAGNOSIS — E119 Type 2 diabetes mellitus without complications: Secondary | ICD-10-CM | POA: Insufficient documentation

## 2014-05-05 DIAGNOSIS — E039 Hypothyroidism, unspecified: Secondary | ICD-10-CM | POA: Insufficient documentation

## 2014-05-05 DIAGNOSIS — Z79899 Other long term (current) drug therapy: Secondary | ICD-10-CM | POA: Insufficient documentation

## 2014-05-05 DIAGNOSIS — Z7982 Long term (current) use of aspirin: Secondary | ICD-10-CM | POA: Insufficient documentation

## 2014-05-05 DIAGNOSIS — I10 Essential (primary) hypertension: Secondary | ICD-10-CM | POA: Diagnosis not present

## 2014-05-05 DIAGNOSIS — J449 Chronic obstructive pulmonary disease, unspecified: Secondary | ICD-10-CM | POA: Insufficient documentation

## 2014-05-05 DIAGNOSIS — J4489 Other specified chronic obstructive pulmonary disease: Secondary | ICD-10-CM | POA: Insufficient documentation

## 2014-05-05 DIAGNOSIS — Z87891 Personal history of nicotine dependence: Secondary | ICD-10-CM | POA: Insufficient documentation

## 2014-05-05 DIAGNOSIS — H2700 Aphakia, unspecified eye: Secondary | ICD-10-CM | POA: Insufficient documentation

## 2014-05-05 HISTORY — PX: PLACEMENT AND SUTURE OF SECONDARY INTRAOCULAR LENS: SHX5338

## 2014-05-05 LAB — GLUCOSE, CAPILLARY: Glucose-Capillary: 162 mg/dL — ABNORMAL HIGH (ref 70–99)

## 2014-05-05 LAB — POCT I-STAT 4, (NA,K, GLUC, HGB,HCT)
GLUCOSE: 170 mg/dL — AB (ref 70–99)
HCT: 35 % — ABNORMAL LOW (ref 39.0–52.0)
Hemoglobin: 11.9 g/dL — ABNORMAL LOW (ref 13.0–17.0)
Potassium: 4.7 mEq/L (ref 3.7–5.3)
Sodium: 137 mEq/L (ref 137–147)

## 2014-05-05 SURGERY — PLACEMENT AND SUTURE OF SECONDARY INTRAOCULAR LENS
Anesthesia: Monitor Anesthesia Care | Laterality: Right

## 2014-05-05 MED ORDER — LACTATED RINGERS IV SOLN
INTRAVENOUS | Status: DC
  Administered 2014-05-05: 10:00:00 via INTRAVENOUS

## 2014-05-05 MED ORDER — FENTANYL CITRATE 0.05 MG/ML IJ SOLN
25.0000 ug | INTRAMUSCULAR | Status: AC
  Administered 2014-05-05: 25 ug via INTRAVENOUS
  Filled 2014-05-05: qty 2

## 2014-05-05 MED ORDER — MIDAZOLAM HCL 2 MG/2ML IJ SOLN
INTRAMUSCULAR | Status: AC
  Filled 2014-05-05: qty 2

## 2014-05-05 MED ORDER — PHENYLEPHRINE HCL 2.5 % OP SOLN
1.0000 [drp] | OPHTHALMIC | Status: AC
  Administered 2014-05-05 (×3): 1 [drp] via OPHTHALMIC

## 2014-05-05 MED ORDER — PHENYLEPHRINE HCL 2.5 % OP SOLN
OPHTHALMIC | Status: AC
  Filled 2014-05-05: qty 15

## 2014-05-05 MED ORDER — EPINEPHRINE HCL 1 MG/ML IJ SOLN
INTRAMUSCULAR | Status: AC
  Filled 2014-05-05: qty 1

## 2014-05-05 MED ORDER — MIDAZOLAM HCL 5 MG/5ML IJ SOLN
INTRAMUSCULAR | Status: DC | PRN
  Administered 2014-05-05: 2 mg via INTRAVENOUS

## 2014-05-05 MED ORDER — MIDAZOLAM HCL 2 MG/2ML IJ SOLN
1.0000 mg | INTRAMUSCULAR | Status: DC | PRN
  Administered 2014-05-05: 2 mg via INTRAVENOUS
  Filled 2014-05-05: qty 2

## 2014-05-05 MED ORDER — PILOCARPINE HCL 1 % OP SOLN
1.0000 [drp] | OPHTHALMIC | Status: AC
  Administered 2014-05-05 (×3): 1 [drp] via OPHTHALMIC

## 2014-05-05 MED ORDER — KETOROLAC TROMETHAMINE 0.5 % OP SOLN
1.0000 [drp] | OPHTHALMIC | Status: AC
  Administered 2014-05-05 (×3): 1 [drp] via OPHTHALMIC

## 2014-05-05 MED ORDER — KETOROLAC TROMETHAMINE 0.5 % OP SOLN
OPHTHALMIC | Status: AC
  Filled 2014-05-05: qty 5

## 2014-05-05 MED ORDER — EPINEPHRINE HCL 1 MG/ML IJ SOLN
INTRAOCULAR | Status: DC | PRN
  Administered 2014-05-05: 11:00:00

## 2014-05-05 MED ORDER — TETRACAINE 0.5 % OP SOLN OPTIME - NO CHARGE
OPHTHALMIC | Status: DC | PRN
  Administered 2014-05-05: 2 [drp] via OPHTHALMIC

## 2014-05-05 MED ORDER — PILOCARPINE HCL 1 % OP SOLN
OPHTHALMIC | Status: AC
  Filled 2014-05-05: qty 15

## 2014-05-05 MED ORDER — SODIUM HYALURONATE 23 MG/ML IO SOLN
INTRAOCULAR | Status: DC | PRN
  Administered 2014-05-05: 0.6 mL via INTRAOCULAR

## 2014-05-05 MED ORDER — TETRACAINE HCL 0.5 % OP SOLN
OPHTHALMIC | Status: AC
  Filled 2014-05-05: qty 2

## 2014-05-05 MED ORDER — TETRACAINE HCL 0.5 % OP SOLN
1.0000 [drp] | OPHTHALMIC | Status: AC
  Administered 2014-05-05 (×3): 1 [drp] via OPHTHALMIC

## 2014-05-05 SURGICAL SUPPLY — 13 items
CLOTH BEACON ORANGE TIMEOUT ST (SAFETY) ×3 IMPLANT
EYE SHIELD UNIVERSAL CLEAR (GAUZE/BANDAGES/DRESSINGS) ×3 IMPLANT
GLOVE BIOGEL PI IND STRL 7.0 (GLOVE) ×1 IMPLANT
GLOVE BIOGEL PI INDICATOR 7.0 (GLOVE) ×2
GLOVE EXAM NITRILE MD LF STRL (GLOVE) ×3 IMPLANT
GLOVE INDICATOR 7.0 STRL GRN (GLOVE) ×3 IMPLANT
HEALON 5 0.6 ML (INTRAOCULAR LENS) ×3 IMPLANT
LENS IOL DIOP 15.0 (Intraocular Lens) ×3 IMPLANT
PAD ARMBOARD 7.5X6 YLW CONV (MISCELLANEOUS) ×3 IMPLANT
SIGHTPATH CAT PROC W REG LENS (Ophthalmic Related) ×3 IMPLANT
TAPE SURG TRANSPORE 1 IN (GAUZE/BANDAGES/DRESSINGS) ×1 IMPLANT
TAPE SURGICAL TRANSPORE 1 IN (GAUZE/BANDAGES/DRESSINGS) ×2
WATER STERILE IRR 250ML POUR (IV SOLUTION) ×3 IMPLANT

## 2014-05-05 NOTE — Anesthesia Postprocedure Evaluation (Signed)
  Anesthesia Post-op Note  Patient: William Chambers  Procedure(s) Performed: Procedure(s): PLACEMENT OF SECONDARY INTRAOCULAR LENS (INSERTION OF ANTERIOR CHAMBER LENS) RIGHT EYE AND SUTURE CLOSURE (Right)  Patient Location: Short Stay  Anesthesia Type:MAC  Level of Consciousness: awake, alert , oriented and patient cooperative  Airway and Oxygen Therapy: Patient Spontanous Breathing  Post-op Pain: none  Post-op Assessment: Post-op Vital signs reviewed, Patient's Cardiovascular Status Stable, Respiratory Function Stable, Patent Airway and Pain level controlled  Post-op Vital Signs: Reviewed and stable  Last Vitals:  Filed Vitals:   05/05/14 1045  BP: 133/58  Temp:   Resp: 31    Complications: No apparent anesthesia complications

## 2014-05-05 NOTE — H&P (Signed)
The patient was re examined and there is no change in the patients condition since the original H and P. 

## 2014-05-05 NOTE — Transfer of Care (Signed)
Immediate Anesthesia Transfer of Care Note  Patient: William Chambers  Procedure(s) Performed: Procedure(s): PLACEMENT AND SUTURE OF SECONDARY INTRAOCULAR LENS (INSERTION OF ANTERIOR CHAMBER LENS) RIGHT EYE (Right)  Patient Location: Short Stay  Anesthesia Type:MAC  Level of Consciousness: awake, alert , oriented and patient cooperative  Airway & Oxygen Therapy: Patient Spontanous Breathing  Post-op Assessment: Report given to PACU RN, Post -op Vital signs reviewed and stable and Patient moving all extremities  Post vital signs: Reviewed and stable  Complications: No apparent anesthesia complications

## 2014-05-05 NOTE — Op Note (Signed)
Patient brought to the operating room and prepped and draped in the usual manner.  Lid speculum inserted in right eye.  Stab incision made at the twelve o'clock position.  Healon 5 instilled in the anterior chamber.   A 6.0 mm. Stab incision was made temporally.  Healon 5 deepened the anterior chamber. At this point, a 15.0 Diopter [id number] anterior chamber implant was carefully placed in the anterior chamber.  IOL was centered and well positioned.  An X type 10 0 nylon suture was placed, tightened, tied and cut.   Healon was then removed with the I and A tip.  The wound was then hydrated.  Patient sent to the Recovery Room in good condition with follow up in my office.  Preoperative Diagnosis:  Surgical Aphakia OD Postoperative Diagnosis:  Same Procedure name: Secondary Implant OD

## 2014-05-05 NOTE — Anesthesia Preprocedure Evaluation (Signed)
Anesthesia Evaluation  Patient identified by MRN, date of birth, ID band Patient awake    Reviewed: Allergy & Precautions, NPO status , Patient's Chart, lab work & pertinent test results, reviewed documented beta blocker date and time   Airway Mallampati: II TM Distance: >3 FB Neck ROM: Full    Dental  (+) Teeth Intact   Pulmonary shortness of breath and with exertion, COPDformer smoker,  breath sounds clear to auscultation        Cardiovascular hypertension, Pt. on medications and Pt. on home beta blockers + CAD, + Past MI and + CABG + dysrhythmias Atrial Fibrillation Rhythm:Regular Rate:Normal     Neuro/Psych    GI/Hepatic hiatal hernia,   Endo/Other  diabetes, Type 2, Oral Hypoglycemic AgentsHypothyroidism   Renal/GU      Musculoskeletal   Abdominal   Peds  Hematology   Anesthesia Other Findings   Reproductive/Obstetrics                           Anesthesia Physical Anesthesia Plan  ASA: IV  Anesthesia Plan: MAC   Post-op Pain Management:    Induction: Intravenous  Airway Management Planned: Nasal Cannula  Additional Equipment:   Intra-op Plan:   Post-operative Plan:   Informed Consent: I have reviewed the patients History and Physical, chart, labs and discussed the procedure including the risks, benefits and alternatives for the proposed anesthesia with the patient or authorized representative who has indicated his/her understanding and acceptance.     Plan Discussed with:   Anesthesia Plan Comments:         Anesthesia Quick Evaluation  

## 2014-05-05 NOTE — Discharge Instructions (Signed)
William Chambers  05/05/2014           [facility] Instructions [address] [place] [address] [address]      1. Avoid closing eyes tightly. One often closes the eye tightly when laughing, talking, sneezing, coughing or if they feel irritated. At these times, you should be careful not to close your eyes tightly.  2. Instill eye drops as instructed. To instill drops in your eye, open it, look up and have someone gently pull the lower lid down and instill a couple of drops inside the lower lid.  3. Do not touch upper lid.  4. Take Advil or Tylenol for pain.  5. You may use either eye for near work, such as reading or sewing and you may watch television.  6. You may have your hair done at the beauty parlor at any time.  7. Wear dark glasses with or without your own glasses if you are in bright light.  8. Call our office at [phone] or [phone] if you have sharp pain in your eye or unusual symptoms.  9. Do not be concerned because vision in the operative eye is not good. It will not be good, no matter how successful the operation, until you get a special lens for it. Your old glasses will not be suited to the new eye that was operated on and you will not be ready for a new lens for about a month.  10. Follow up at the [facility] office.    I have received a copy of the above instructions and will follow them.     Follow up with Dr. Nile Riggs today between 2-3 pm

## 2014-05-06 ENCOUNTER — Encounter (HOSPITAL_COMMUNITY): Payer: Self-pay | Admitting: Ophthalmology

## 2014-10-02 ENCOUNTER — Ambulatory Visit (INDEPENDENT_AMBULATORY_CARE_PROVIDER_SITE_OTHER): Payer: Self-pay | Admitting: Cardiovascular Disease

## 2014-10-02 ENCOUNTER — Encounter: Payer: Self-pay | Admitting: Cardiovascular Disease

## 2014-10-02 VITALS — BP 130/68 | HR 65 | Ht 74.0 in | Wt 182.6 lb

## 2014-10-02 DIAGNOSIS — I4891 Unspecified atrial fibrillation: Secondary | ICD-10-CM

## 2014-10-02 DIAGNOSIS — E785 Hyperlipidemia, unspecified: Secondary | ICD-10-CM

## 2014-10-02 DIAGNOSIS — I2510 Atherosclerotic heart disease of native coronary artery without angina pectoris: Secondary | ICD-10-CM

## 2014-10-02 MED ORDER — BENAZEPRIL HCL 10 MG PO TABS: 10.0000 mg | ORAL_TABLET | Freq: Every day | ORAL | Status: DC

## 2014-10-02 MED ORDER — NITROGLYCERIN 0.4 MG SL SUBL: 0.4000 mg | SUBLINGUAL_TABLET | SUBLINGUAL | Status: DC | PRN

## 2014-10-02 NOTE — Progress Notes (Signed)
Cardiology Office Note   Date:  10/02/2014   ID:  William NgoJohn L Brannan, DOB 03/19/1923, MRN [id number]  PCP:  Toma DeitersHASANAJ,XAJE A, MD  Cardiologist:   Vesta MixerNahser, Ajahnae Rathgeber J, MD   Chief Complaint  Patient presents with  . Coronary Artery Disease   Problem List:   1. CAD, CABG 2. Paroxysmal Atrial fibrillation 3. Diabetes mellitus 4. Hyperlipidemia   History of Present Illness: William Chambers is a 81 y.o. male who presents for  Follow up of his CAD, and atrial fib.  Has been doing well. No CP or dyspnea.  Has had some right eye surgery and needs to have a corneal transplant.   He may need general anesthesia . Has dyspnea related to his COPD  Has occasional palpitations in his chest  - sound like PVCs.      Past Medical History  Diagnosis Date  . Coronary artery disease     post CABG  . Hyperlipidemia   . Diabetes mellitus   . Hypertension   . Atrial fibrillation   . Hypothyroidism   . Benign prostatic hypertrophy   . Gout   . Dizziness   . Anemia   . History of GI bleed   . History of angina pectoris     hypertensive   . Hiatal hernia   . Osteoarthritis   . Myocardial infarction 1995  . Shortness of breath   . COPD (chronic obstructive pulmonary disease)     Past Surgical History  Procedure Laterality Date  . Bowel resection    . Tonsillectomy and adenoidectomy    . Coronary angioplasty with stent placement  05/1997  . Cardiac catheterization  12/21/2003    ejection fraction of approximately 65%  . Coronary artery bypass graft  01/21/2004     x6 using a left internal mammary  artery graft to the LAD coronary artery, with a saphenous vein graft to the second diagonal branch of the LAD coronary artery  with a sequential saphenous vein graft to the first diagonal branch of the LAD & then the obtuse  marginal coronary artery and the posterolateral branch of the RCA &  saphenous vein graft to the  posterior descending coronary artery   . Retinal detachment surgery Left   .  Cataract extraction w/phaco Right 02/17/2014    Procedure: CATARACT EXTRACTION PHACO AND INTRAOCULAR LENS PLACEMENT (IOC);  Surgeon: Loraine LericheMark T. Nile RiggsShapiro, MD;  Location: [facility];  Service: Ophthalmology;  Laterality: Right;  CDE 10.81  . Hernia repair      x 3- 1 left and 2 on right  . Placement and suture of secondary intraocular lens Right 05/05/2014    Procedure: PLACEMENT OF SECONDARY INTRAOCULAR LENS (INSERTION OF ANTERIOR CHAMBER LENS) RIGHT EYE AND SUTURE CLOSURE;  Surgeon: Loraine LericheMark T. Nile RiggsShapiro, MD;  Location: [facility];  Service: Ophthalmology;  Laterality: Right;     Current Outpatient Prescriptions  Medication Sig Dispense Refill  . acetaminophen (TYLENOL) 500 MG tablet Take 1,000 mg by mouth daily as needed for moderate pain.    Marland Kitchen. alfuzosin (UROXATRAL) 10 MG 24 hr tablet Take 10 mg by mouth daily.     Marland Kitchen. allopurinol (ZYLOPRIM) 300 MG tablet Take 300 mg by mouth daily.     . Alpha-Lipoic Acid 600 MG CAPS Take 1 capsule by mouth 2 (two) times daily.      Marland Kitchen. aspirin 81 MG tablet Take 81 mg by mouth daily.      Marland Kitchen. atorvastatin (LIPITOR) 40 MG tablet Take 40 mg  by mouth at bedtime.     Marland Kitchen azelastine (ASTELIN) 137 MCG/SPRAY nasal spray Place 1 spray into the nose daily. Use in each nostril as directed     . benazepril (LOTENSIN) 10 MG tablet Take 1 tablet (10 mg total) by mouth daily. 30 tablet 11  . Biotin (BIOTIN 5000) 5 MG CAPS Take 1 capsule by mouth 3 (three) times daily.      . budesonide-formoterol (SYMBICORT) 80-4.5 MCG/ACT inhaler Inhale 2 puffs into the lungs daily.    . calcium carbonate (TUMS - DOSED IN MG ELEMENTAL CALCIUM) 500 MG chewable tablet Chew 2 tablets by mouth at bedtime as needed for indigestion or heartburn.    . Calcium Carbonate-Vitamin D (CALCIUM 600+D) 600-200 MG-UNIT TABS Take 2 tablets by mouth 2 (two) times daily.    . carisoprodol (SOMA) 350 MG tablet Take 175 mg by mouth at bedtime as needed for muscle spasms.     . Coenzyme Q10 (COQ-10 PO) Take 1 tablet by mouth 2 (two)  times daily.     . digoxin (LANOXIN) 0.125 MG tablet Take 1 tablet (0.125 mg total) by mouth daily. 30 tablet 11  . docusate sodium (COLACE) 100 MG capsule Take 300 mg by mouth at bedtime as needed for moderate constipation.     . Evening Primrose Oil 1000 MG CAPS Take 1 capsule by mouth 4 (four) times daily.      . famotidine (PEPCID) 20 MG tablet Take 20 mg by mouth daily.    . fenofibrate (TRICOR) 145 MG tablet Take 1 tablet (145 mg total) by mouth daily. 90 tablet 3  . finasteride (PROSCAR) 5 MG tablet Take 5 mg by mouth daily.      Marland Kitchen ipratropium (ATROVENT) 0.06 % nasal spray USE 2 PUFF TWICE A DAY    . Krill Oil 300 MG CAPS Take 600 mg by mouth 2 (two) times daily.    Marland Kitchen LEVEMIR FLEXTOUCH 100 UNIT/ML Pen Inject 20 Units into the skin at bedtime.     Marland Kitchen levothyroxine (SYNTHROID, LEVOTHROID) 50 MCG tablet Take 50 mcg by mouth daily.      . metFORMIN (GLUCOPHAGE) 500 MG tablet Take 1,000 mg by mouth 2 (two) times daily with a meal.     . metoprolol (LOPRESSOR) 50 MG tablet Take 50 mg by mouth 2 (two) times daily.    . Multiple Vitamin (MULTIVITAMIN WITH MINERALS) TABS tablet Take 1 tablet by mouth daily.    . naproxen sodium (ALEVE) 220 MG tablet Take 220 mg by mouth 2 (two) times daily with a meal.    . nitroGLYCERIN (NITROSTAT) 0.4 MG SL tablet Place 0.4 mg under the tongue every 5 (five) minutes as needed.      Marland Kitchen ofloxacin (OCUFLOX) 0.3 % ophthalmic solution Place 1 drop into the right eye 4 (four) times daily.    . polyvinyl alcohol (LIQUITEARS) 1.4 % ophthalmic solution Place 1 drop into both eyes as needed for dry eyes.    Marland Kitchen PROLENSA 0.07 % SOLN Place 1 drop into the right eye 4 (four) times daily.    . propranolol (INDERAL) 10 MG tablet Take 10 mg by mouth daily as needed. For palpitations    . Simethicone 125 MG CAPS Take 250 mg by mouth at bedtime as needed (stomach pain).      No current facility-administered medications for this visit.    Allergies:   Shellfish allergy and  Grapeseed extract    Social History:  The patient  reports that he  quit smoking about 19 years ago. His smoking use included Cigarettes. He has a 55 pack-year smoking history. He does not have any smokeless tobacco history on file. He reports that he drinks alcohol. He reports that he does not use illicit drugs.   Family History:  The patient's family history includes Cancer in his mother; Coronary artery disease in his father; Heart attack in his father; Hypertension in his mother; Obesity in his child.    ROS:  Please see the history of present illness.    Review of Systems: Constitutional:  denies fever, chills, diaphoresis, appetite change and fatigue.  HEENT: denies photophobia,  - does have eye pain,  And loss of site   .  Respiratory: denies SOB, DOE, cough, chest tightness, and wheezing.  Cardiovascular: denies chest pain, palpitations and leg swelling.  Gastrointestinal: denies nausea, vomiting, abdominal pain, diarrhea, constipation, blood in stool.  Genitourinary: denies dysuria, urgency, frequency, hematuria, flank pain and difficulty urinating.  Musculoskeletal: denies  myalgias, back pain, joint swelling, arthralgias and gait problem.   Skin: denies pallor, rash and wound.  Neurological: denies dizziness, seizures, syncope, weakness, light-headedness, numbness and headaches.   Hematological: denies adenopathy, easy bruising, personal or family bleeding history.  Psychiatric/ Behavioral: denies suicidal ideation, mood changes, confusion, nervousness, sleep disturbance and agitation.       All other systems are reviewed and negative.    PHYSICAL EXAM: VS:  BP 130/68 mmHg  Pulse 65  Ht  (1.88 m)  Wt 182 lb 9.6 oz (82.827 kg)  BMI 23.43 kg/m2 , BMI Body mass index is 23.43 kg/(m^2). GEN: Well nourished, well developed, in no acute distress HEENT: normal Neck: no JVD, carotid bruits, or masses Cardiac: RRR; no murmurs, rubs, or gallops, trace bilateral  edema    Respiratory:  clear to auscultation bilaterally, normal work of breathing GI: soft, nontender, nondistended, + BS MS: no deformity or atrophy Skin: warm and dry, no rash Neuro:  Strength and sensation are intact Psych: normal   EKG:  EKG is ordered today. The ekg ordered today demonstrates NSR at 65,  Occasional PACs, TWI in inferferior and lateral leads     Recent Labs: 04/30/2014: BUN 24*; Creatinine 1.07 05/05/2014: Hemoglobin 11.9*; Potassium 4.7; Sodium 137    Lipid Panel No results found for: CHOL, TRIG, HDL, CHOLHDL, VLDL, LDLCALC, LDLDIRECT    Wt Readings from Last 3 Encounters:  10/02/14 182 lb 9.6 oz (82.827 kg)  10/02/13 190 lb 12.8 oz (86.546 kg)  09/13/12 194 lb (87.998 kg)      Other studies Reviewed: Additional studies/ records that were reviewed today include: . Review of the above records demonstrates:    ASSESSMENT AND PLAN:  1. CAD, , s/p stenting in 1998, CABG in 2005.  No angina .  2. Paroxysmal Atrial fibrillation - remains in NSR   3. Diabetes mellitus- followed by his general medical doctor   4. Hyperlipidemia- monitored by his primary medical doctor. , continue atorvastatin and fenofibrate    Current medicines are reviewed at length with the patient today.  The patient does not have concerns regarding medicines.  The following changes have been made:  no change   Disposition:   FU with me 1 year .     Signed, Darrelle Barrell, Deloris Ping, MD  10/02/2014 11:21 AM    [facility] [address] [place], [place], [place]  [address] Phone: [phone]; Fax: [phone]

## 2014-10-02 NOTE — Patient Instructions (Signed)
Your physician has recommended you make the following change in your medication:  STOP Digoxin  Your physician wants you to follow-up in: 1 year with Dr. Nahser.  You will receive a reminder letter in the mail two months in advance. If you don't receive a letter, please call our office to schedule the follow-up appointment.  

## 2015-10-06 ENCOUNTER — Ambulatory Visit (INDEPENDENT_AMBULATORY_CARE_PROVIDER_SITE_OTHER): Payer: Medicare Other | Admitting: Cardiovascular Disease

## 2015-10-06 ENCOUNTER — Encounter: Payer: Self-pay | Admitting: Cardiovascular Disease

## 2015-10-06 VITALS — BP 126/60 | HR 76 | Ht 74.0 in | Wt 199.8 lb

## 2015-10-06 DIAGNOSIS — E785 Hyperlipidemia, unspecified: Secondary | ICD-10-CM

## 2015-10-06 DIAGNOSIS — I2510 Atherosclerotic heart disease of native coronary artery without angina pectoris: Secondary | ICD-10-CM

## 2015-10-06 DIAGNOSIS — I480 Paroxysmal atrial fibrillation: Secondary | ICD-10-CM

## 2015-10-06 MED ORDER — PROPRANOLOL HCL 10 MG PO TABS: 10.0000 mg | ORAL_TABLET | Freq: Every day | ORAL | Status: AC | PRN

## 2015-10-06 MED ORDER — BENAZEPRIL HCL 10 MG PO TABS: 10.0000 mg | ORAL_TABLET | Freq: Every day | ORAL | Status: AC

## 2015-10-06 MED ORDER — NITROGLYCERIN 0.4 MG SL SUBL: 0.4000 mg | SUBLINGUAL_TABLET | SUBLINGUAL | Status: AC | PRN

## 2015-10-06 NOTE — Progress Notes (Signed)
Cardiology Office Note   Date:  10/06/2015   ID:  [name], DOB [birth year], MRN [id number]  PCP:  Toma Deiters, MD  Cardiologist:   Vesta Mixer, MD   Chief Complaint  Patient presents with  . Follow-up    CAD , atrial fib   Problem List:   1. CAD, CABG 2. Paroxysmal Atrial fibrillation 3. Diabetes mellitus 4. Hyperlipidemia   History of Present Illness: William Chambers is a 82 y.o. male who presents for  Follow up of his CAD, and atrial fib.  Has been doing well. No CP or dyspnea.  Has had some right eye surgery and needs to have a corneal transplant.   He may need general anesthesia . Has dyspnea related to his COPD  Has occasional palpitations in his chest  - sound like PVCs.   Feb. 8, 2017:  Has continued to have trouble with his eye.   Did not have his corneal transplant.  Does not want to have eye surgery. Larey Seat on his sidewalk  Since that time  , he has been having episodes of projectile nausea and vomitting . Seems to be better after taking miclizine  Has seen his medical doctor .  Had CT of head that was negative. Seems to be more unstable     Past Medical History  Diagnosis Date  . Coronary artery disease     post CABG  . Hyperlipidemia   . Diabetes mellitus   . Hypertension   . Atrial fibrillation (HCC)   . Hypothyroidism   . Benign prostatic hypertrophy   . Gout   . Dizziness   . Anemia   . History of GI bleed   . History of angina pectoris     hypertensive   . Hiatal hernia   . Osteoarthritis   . Myocardial infarction (HCC) 1995  . Shortness of breath   . COPD (chronic obstructive pulmonary disease) [facility])     Past Surgical History  Procedure Laterality Date  . Bowel resection    . Tonsillectomy and adenoidectomy    . Coronary angioplasty with stent placement  05/1997  . Cardiac catheterization  12/21/2003    ejection fraction of approximately 65%  . Coronary artery bypass graft  01/21/2004     x6 using a left internal  mammary  artery graft to the LAD coronary artery, with a saphenous vein graft to the second diagonal branch of the LAD coronary artery  with a sequential saphenous vein graft to the first diagonal branch of the LAD & then the obtuse  marginal coronary artery and the posterolateral branch of the RCA &  saphenous vein graft to the  posterior descending coronary artery   . Retinal detachment surgery Left   . Cataract extraction w/phaco Right 02/17/2014    Procedure: CATARACT EXTRACTION PHACO AND INTRAOCULAR LENS PLACEMENT (IOC);  Surgeon: Loraine Leriche T. Nile Riggs, MD;  Location: [facility];  Service: Ophthalmology;  Laterality: Right;  CDE 10.81  . Hernia repair      x 3- 1 left and 2 on right  . Placement and suture of secondary intraocular lens Right 05/05/2014    Procedure: PLACEMENT OF SECONDARY INTRAOCULAR LENS (INSERTION OF ANTERIOR CHAMBER LENS) RIGHT EYE AND SUTURE CLOSURE;  Surgeon: Loraine Leriche T. Nile Riggs, MD;  Location: [facility];  Service: Ophthalmology;  Laterality: Right;     Current Outpatient Prescriptions  Medication Sig Dispense Refill  . acetaminophen (TYLENOL) 500 MG tablet Take 1,000 mg by mouth daily as  needed for moderate pain.    Marland Kitchen allopurinol (ZYLOPRIM) 300 MG tablet Take 300 mg by mouth daily.     Marland Kitchen aspirin 81 MG tablet Take 81 mg by mouth daily.      Marland Kitchen atorvastatin (LIPITOR) 40 MG tablet Take 40 mg by mouth at bedtime.     . benazepril (LOTENSIN) 10 MG tablet Take 1 tablet (10 mg total) by mouth daily. 90 tablet 3  . Calcium Carbonate-Vitamin D (CALCIUM 600+D) 600-200 MG-UNIT TABS Take 2 tablets by mouth 2 (two) times daily.    Marland Kitchen docusate sodium (COLACE) 100 MG capsule Take 300 mg by mouth at bedtime as needed for moderate constipation.     . famotidine (PEPCID) 20 MG tablet Take 20 mg by mouth daily.    . fenofibrate (TRICOR) 145 MG tablet Take 1 tablet (145 mg total) by mouth daily. 90 tablet 3  . finasteride (PROSCAR) 5 MG tablet Take 5 mg by mouth daily.      . fluticasone (FLONASE) 50  MCG/ACT nasal spray Place 1 spray into both nostrils daily.    . fluticasone-salmeterol (ADVAIR HFA) 115-21 MCG/ACT inhaler Inhale 2 puffs into the lungs 2 (two) times daily.    Marland Kitchen guaifenesin (HUMIBID E) 400 MG TABS tablet Take 400 mg by mouth as directed.    Marland Kitchen LANTUS SOLOSTAR 100 UNIT/ML Solostar Pen Inject 14 Units as directed at bedtime.  0  . levothyroxine (SYNTHROID, LEVOTHROID) 50 MCG tablet Take 50 mcg by mouth daily.      . meclizine (ANTIVERT) 25 MG tablet Take 25 mg by mouth 3 (three) times daily as needed for dizziness.    . metFORMIN (GLUCOPHAGE) 500 MG tablet Take 1,000 mg by mouth 2 (two) times daily with a meal.     . metoprolol (LOPRESSOR) 50 MG tablet Take 50 mg by mouth 2 (two) times daily.    . naproxen sodium (ALEVE) 220 MG tablet Take 220 mg by mouth 2 (two) times daily with a meal.    . nitroGLYCERIN (NITROSTAT) 0.4 MG SL tablet Place 1 tablet (0.4 mg total) under the tongue every 5 (five) minutes as needed. 25 tablet 6  . polyvinyl alcohol (LIQUITEARS) 1.4 % ophthalmic solution Place 1 drop into both eyes as needed for dry eyes.    Marland Kitchen propranolol (INDERAL) 10 MG tablet Take 10 mg by mouth daily as needed. For palpitations    . tamsulosin (FLOMAX) 0.4 MG CAPS capsule Take 0.4 mg by mouth daily.  0  . vitamin B-12 (CYANOCOBALAMIN) 100 MCG tablet Take 100 mcg by mouth daily.    . Vitamin D, Cholecalciferol, 1000 units CAPS Take 1,000 mg by mouth daily.     No current facility-administered medications for this visit.    Allergies:   Shellfish allergy and Grapeseed extract    Social History:  The patient  reports that he quit smoking about 20 years ago. His smoking use included Cigarettes. He has a 55 pack-year smoking history. He does not have any smokeless tobacco history on file. He reports that he drinks alcohol. He reports that he does not use illicit drugs.   Family History:  The patient's family history includes Cancer in his mother; Coronary artery disease in his  father; Heart attack in his father; Hypertension in his mother; Obesity in his child.    ROS:  Please see the history of present illness.    Review of Systems: Constitutional:  denies fever, chills, diaphoresis, appetite change and fatigue.  HEENT: denies photophobia,  -  does have eye pain,  And loss of site   .  Respiratory: denies SOB, DOE, cough, chest tightness, and wheezing.  Cardiovascular: denies chest pain, palpitations and leg swelling.  Gastrointestinal: denies nausea, vomiting, abdominal pain, diarrhea, constipation, blood in stool.  Genitourinary: denies dysuria, urgency, frequency, hematuria, flank pain and difficulty urinating.  Musculoskeletal: denies  myalgias, back pain, joint swelling, arthralgias and gait problem.   Skin: denies pallor, rash and wound.  Neurological: denies dizziness, seizures, syncope, weakness, light-headedness, numbness and headaches.   Hematological: denies adenopathy, easy bruising, personal or family bleeding history.  Psychiatric/ Behavioral: denies suicidal ideation, mood changes, confusion, nervousness, sleep disturbance and agitation.       All other systems are reviewed and negative.    PHYSICAL EXAM: VS:  BP 126/60 mmHg  Pulse 76  Ht  (1.88 m)  Wt 199 lb 12.8 oz (90.629 kg)  BMI 25.64 kg/m2 , BMI Body mass index is 25.64 kg/(m^2). GEN: Well nourished, well developed, in no acute distress HEENT: normal Neck: no JVD, carotid bruits, or masses Cardiac: RRR; no murmurs, rubs, or gallops, 1-2 +    edema  Respiratory:  clear to auscultation bilaterally, normal work of breathing GI: soft, nontender, nondistended, + BS MS: no deformity or atrophy Skin: warm and dry, no rash Neuro:  Strength and sensation are intact Psych: normal   EKG:  EKG is ordered today. The ekg ordered today demonstrates NSR at 80 with Mobitz I AV block  TWI in the inferior and lateral leads.    Recent Labs: No results found for requested labs within  last 365 days.    Lipid Panel No results found for: CHOL, TRIG, HDL, CHOLHDL, VLDL, LDLCALC, LDLDIRECT    Wt Readings from Last 3 Encounters:  10/06/15 199 lb 12.8 oz (90.629 kg)  10/02/14 182 lb 9.6 oz (82.827 kg)  04/30/14 191 lb (86.637 kg)      Other studies Reviewed: Additional studies/ records that were reviewed today include: . Review of the above records demonstrates:    ASSESSMENT AND PLAN:  1. CAD, , s/p stenting in 1998, CABG in 2005.  No angina . Seems to be very stable  BP has been stable Cholesterol levels have been managed by his medical doctor .   2. Paroxysmal Atrial fibrillation - remains in NSR   3. Diabetes mellitus- followed by his general medical doctor   4. Hyperlipidemia- monitored by his primary medical doctor. , continue atorvastatin and fenofibrate   5. Leg edema :   Has 2 + edema today.   The edema resolves overnight then builds up during the day Advised compression hose and leg elevation.   I hesitate to start Laisix - he is already unsteady and I worry that it would cause orthostatic hypotension   Current medicines are reviewed at length with the patient today.  The patient does not have concerns regarding medicines.  The following changes have been made:  no change   Disposition:   FU with me 1 year .     Signed, Lesley Galentine, Deloris Ping, MD  10/06/2015 11:48 AM    [facility] [address] [place], [place], [place]  [address] Phone: [phone]; Fax: [phone]

## 2015-10-06 NOTE — Patient Instructions (Addendum)
Medication Instructions:  Your physician recommends that you continue on your current medications as directed. Please refer to the Current Medication list given to you today. Refills of Benazepril, Nitroglycerin, and Propranolol have been sent   Labwork: None Ordered   Testing/Procedures: None Ordered   Follow-Up: Your physician wants you to follow-up in: 1 year with Dr. Elease Hashimoto.  You will receive a reminder letter in the mail two months in advance. If you don't receive a letter, please call our office to schedule the follow-up appointment.   If you need a refill on your cardiac medications before your next appointment, please call your pharmacy.   Thank you for choosing [facility]! Eligha Bridegroom, RN [phone]

## 2015-12-07 ENCOUNTER — Ambulatory Visit (HOSPITAL_COMMUNITY): Payer: Medicare Other | Attending: Internal Medicine | Admitting: Physical Therapy

## 2015-12-07 DIAGNOSIS — R293 Abnormal posture: Secondary | ICD-10-CM | POA: Diagnosis present

## 2015-12-07 DIAGNOSIS — R2681 Unsteadiness on feet: Secondary | ICD-10-CM

## 2015-12-07 DIAGNOSIS — M6281 Muscle weakness (generalized): Secondary | ICD-10-CM | POA: Diagnosis present

## 2015-12-07 DIAGNOSIS — H8111 Benign paroxysmal vertigo, right ear: Secondary | ICD-10-CM | POA: Diagnosis present

## 2015-12-07 NOTE — Therapy (Signed)
[facility] [address] [place], [place], [address] Phone: [phone]   Fax:  [phone]  Physical Therapy Evaluation  Patient Details  Name: William Chambers MRN: [id number] Date of Birth: 08/26/1923 No Data Recorded  Encounter Date: 12/07/2015      William Chambers - 12/07/15 1653    Visit Number 1   Number of Visits 16   Date for William Re-Evaluation 01/06/16   Authorization Type BC[employer] medicare   Authorization - Visit Number 1   Authorization - Number of Visits 10   William Start Time 0115   William Stop Time 1208   William Time Calculation (min) 653 min   Activity Tolerance Patient tolerated treatment well      Past Medical History  Diagnosis Date  . Coronary artery disease     post CABG  . Hyperlipidemia   . Diabetes mellitus   . Hypertension   . Atrial fibrillation (HCC)   . Hypothyroidism   . Benign prostatic hypertrophy   . Gout   . Dizziness   . Anemia   . History of GI bleed   . History of angina pectoris     hypertensive   . Hiatal hernia   . Osteoarthritis   . Myocardial infarction (HCC) 1995  . Shortness of breath   . COPD (chronic obstructive pulmonary disease) [facility](HCC)     Past Surgical History  Procedure Laterality Date  . Bowel resection    . Tonsillectomy and adenoidectomy    . Coronary angioplasty with stent placement  05/1997  . Cardiac catheterization  12/21/2003    ejection fraction of approximately 65%  . Coronary artery bypass graft  01/21/2004     x6 using a left internal mammary  artery graft to the LAD coronary artery, with a saphenous vein graft to the second diagonal branch of the LAD coronary artery  with a sequential saphenous vein graft to the first diagonal branch of the LAD & then the obtuse  marginal coronary artery and the posterolateral branch of the RCA &  saphenous vein graft to the  posterior descending coronary artery   . Retinal detachment surgery Left   . Cataract extraction w/phaco Right  02/17/2014    Procedure: CATARACT EXTRACTION PHACO AND INTRAOCULAR LENS PLACEMENT (IOC);  Surgeon: Loraine LericheMark T. Nile RiggsShapiro, MD;  Location: [facility];  Service: Ophthalmology;  Laterality: Right;  CDE 10.81  . Hernia repair      x 3- 1 left and 2 on right  . Placement and suture of secondary intraocular lens Right 05/05/2014    Procedure: PLACEMENT OF SECONDARY INTRAOCULAR LENS (INSERTION OF ANTERIOR CHAMBER LENS) RIGHT EYE AND SUTURE CLOSURE;  Surgeon: Loraine LericheMark T. Nile RiggsShapiro, MD;  Location: [facility];  Service: Ophthalmology;  Laterality: Right;    There were no vitals filed for this visit.       Subjective Assessment - 12/07/15 1650    Subjective [name] that he has been dizzy off and on for three months now.  There has been four times that he has been so dizzy that he has thrown up.     Pertinent History Rt eye has had a failed cataract surgery has limited vision in the right eye.  William normally holds his right eye closed.  William occasionally wears a patch.  HTN, DM, COPD, CABG             [facility] William Assessment - 12/07/15 0001    Functional Tests  Functional tests Single leg stance   Single Leg Stance   Comments unable to single leg stance on either leg    Posture/Postural Control   Posture/Postural Control Postural limitations   Postural Limitations Rounded Shoulders;Forward head;Decreased lumbar lordosis;Decreased thoracic kyphosis            Vestibular Assessment - 12/07/15 0001    Vestibular Assessment   General Observation William is ambulating with two canes he states that he uses the canes for safety since his dizziness has began for safety.    Symptom Behavior   Type of Dizziness Imbalance   Frequency of Dizziness less than once a month but patient takes 25 mg meclizine every 5-6 hours.    Duration of Dizziness stops after her vomits    Aggravating Factors Spontaneous onset   Relieving Factors Comments  vomitting    Occulomotor Exam   Occulomotor Alignment Abnormal  Rt lower     Gaze-induced Absent   Head shaking Horizontal Absent   Smooth Pursuits Intact   Saccades Poor trajectory   Vestibulo-Occular Reflex   VOR 1 Head Only (x 1 viewing) x10   VOR 2 Head and Object (x 2 viewing) x5   Positional Testing   Dix-Hallpike Dix-Hallpike Right;Dix-Hallpike Left  William does not tolerate well but no nystagmus   Positional Sensitivities   Supine to Left Side No dizziness   Supine to Right Side No dizziness   Up from Right Hallpike Lightheadedness   Up from Left Hallpike No dizziness                Vestibular Treatment/Exercise - 12/07/15 0001    Vestibular Treatment/Exercise   Habituation Exercises Seated Horizontal Head Turns;Seated Vertical Head Turns   Gaze Exercises X1 Viewing Horizontal;X1 Viewing Vertical   Seated Horizontal Head Turns   Number of Reps  5   Seated Vertical Head Turns   Number of Reps  5   X1 Viewing Horizontal   Foot Position on floor   X1 Viewing Vertical   Foot Position on floor    Reps 5               William Education - 12/07/15 1641    Education provided Yes   Education Details Habituation as well as eye exercises    Person(s) Educated Patient   Methods Explanation;Demonstration   Comprehension Verbalized understanding;Returned demonstration          William Short Term Goals - 12/07/15 1656    William SHORT TERM GOAL #1   Title William to feel confident reaching up to the top shelf without having sx of dizziness    Time 4   Period Weeks   Status New   William SHORT TERM GOAL #2   Title William to be able to come sit to right side lying and right sidelying to sitting without any increased dizziness   Time 4   Period Weeks   Status New   William SHORT TERM GOAL #3   Title William to be independent in home habituation exercises to decrease sx of dizziness   Time 4   Period Weeks   Status New   William SHORT TERM GOAL #4   Title William to be able to single leg stance for five seconds on both legs to reduce risk of falls    Time 4   Period Weeks    Status New           William Long Term Goals - 12/07/15 1654  William LONG TERM GOAL #1   Title William to be able to single leg stance for 10 seconds on both legs to reduce risk of falls    Time 8   Period Weeks   Status New   William LONG TERM GOAL #2   Title William to be able to ambulate with one cane with confidence to allow one hand to be free to open doors, carry items.   Time 8   Period Weeks   Status New   William LONG TERM GOAL #3   Title William to state he has had no symptoms of dizziness in the past 2 weeks to reduce risk of falls.    Time 8   Period Weeks   Status New               Plan - Dec 30, 2015 1644    Clinical Impression Statement William Chambers is a 82 yo male who states that he has had short bouts of dizziness for three months now.  He can not pin point it as to whether it occurs with certain head movements. Positional changes during the evaluation causes the William to state that he feels like "something might happen" but can not reproduce the symptoms.  William Chambers will benefit from skilled William to address both his vestibular and his balance deficits in order to decrease his risk of falling    Rehab Potential Good   William Frequency 2x / week   William Duration 4 weeks   William Treatment/Interventions Canalith Repostioning;Functional mobility training;Therapeutic activities;Therapeutic exercise;Balance training;Neuromuscular re-education;Patient/family education;Manual techniques   William Next Visit Plan Begin tandem stance, single leg stance, continue with habituation exercises and begin manual on cervical to improve ROM>    Consulted and Agree with Plan of Care Patient      Patient will benefit from skilled therapeutic intervention in order to improve the following deficits and impairments:  Abnormal gait, Decreased activity tolerance, Decreased balance, Dizziness, Difficulty walking, Decreased range of motion  Visit Diagnosis: Unsteadiness on feet  Abnormal posture  BPPV (benign paroxysmal  positional vertigo), right  Muscle weakness (generalized)      G-Codes - 12/30/2015 1701    Functional Assessment Tool Used subjective report of dizziness   Functional Limitation Other William primary   Other William Primary Current Status (W0981) At least 60 percent but less than 80 percent impaired, limited or restricted   Other William Primary Goal Status (X9147) At least 20 percent but less than 40 percent impaired, limited or restricted       Problem List Patient Active Problem List   Diagnosis Date Noted  . CAD (coronary artery disease) 03/03/2011  . A-fib (HCC) 03/03/2011  . Hyperlipemia 03/03/2011  . Hypothyroidism 03/03/2011    William Chambers, William Chambers [phone] 12/30/2015, 5:03 PM  [facility] [address] [place], [place], [address] Phone: [phone]   Fax:  [phone]  Name: William Chambers MRN: [id number] Date of Birth: 1923/01/15

## 2015-12-07 NOTE — Patient Instructions (Signed)
Gaze Stabilization: Tip Card  1.Target must remain in focus, not blurry, and appear stationary while head is in motion. 2.Perform exercises with small head movements (45 to either side of midline). 3.Increase speed of head motion so long as target is in focus. 4.If you wear eyeglasses, be sure you can see target through lens (therapist will give specific instructions for bifocal / progressive lenses). 5.These exercises may provoke dizziness or nausea. Work through these symptoms. If too dizzy, slow head movement slightly. Rest between each exercise. 6.Exercises demand concentration; avoid distractions. 7.For safety, perform standing exercises close to a counter, wall, corner, or next to someone.  Copyright  VHI. All rights reserved.  Oculomotor: Smooth Pursuits    Holding a target, keep eyes on target and slowly move target side to side with head still. Perform sitting. Repeat __10__ times per session. Do 4____ sessions per day. Repeat moving target up and down  Copyright  VHI. All rights reserved.  Movements: Eyes Only (Pictorial Reference)    Therapist: Use this card with Eye Exercises 14 through 17.   Copyright  VHI. All rights reserved.  Sit to Side-Lying    Sit on edge of bed. 1. Turn head 45 to right. 2. Maintain head position and lie down slowly on right side. Hold until symptoms subside. 3. Sit up slowly. Hold until symptoms subside. Repeat sequence _3-_5__ times per session. Do _5___ sessions per day.  Copyright  VHI. All rights reserved.

## 2015-12-10 ENCOUNTER — Ambulatory Visit (HOSPITAL_COMMUNITY): Payer: Medicare Other

## 2015-12-10 DIAGNOSIS — H8111 Benign paroxysmal vertigo, right ear: Secondary | ICD-10-CM

## 2015-12-10 DIAGNOSIS — M6281 Muscle weakness (generalized): Secondary | ICD-10-CM

## 2015-12-10 DIAGNOSIS — R2681 Unsteadiness on feet: Secondary | ICD-10-CM

## 2015-12-10 DIAGNOSIS — R293 Abnormal posture: Secondary | ICD-10-CM

## 2015-12-10 NOTE — Therapy (Signed)
[facility] [address] [place], [place], [address] Phone: [phone]   Fax:  [phone]  Physical Therapy Treatment  Patient Details  Name: William Chambers MRN: [id number] Date of Birth: Nov 30, 1922 No Data Recorded  Encounter Date: 12/10/2015      PT End of Session - 12/10/15 1635    Visit Number 2   Number of Visits 16   Authorization Type BC[employer] medicare   Authorization - Visit Number 2   Authorization - Number of Visits 10   PT Start Time 1520   PT Stop Time 1603   PT Time Calculation (min) 43 min   Activity Tolerance Patient tolerated treatment well   Behavior During Therapy [facility] for tasks assessed/performed      Past Medical History  Diagnosis Date  . Coronary artery disease     post CABG  . Hyperlipidemia   . Diabetes mellitus   . Hypertension   . Atrial fibrillation (HCC)   . Hypothyroidism   . Benign prostatic hypertrophy   . Gout   . Dizziness   . Anemia   . History of GI bleed   . History of angina pectoris     hypertensive   . Hiatal hernia   . Osteoarthritis   . Myocardial infarction (HCC) 1995  . Shortness of breath   . COPD (chronic obstructive pulmonary disease) [facility])     Past Surgical History  Procedure Laterality Date  . Bowel resection    . Tonsillectomy and adenoidectomy    . Coronary angioplasty with stent placement  05/1997  . Cardiac catheterization  12/21/2003    ejection fraction of approximately 65%  . Coronary artery bypass graft  01/21/2004     x6 using a left internal mammary  artery graft to the LAD coronary artery, with a saphenous vein graft to the second diagonal branch of the LAD coronary artery  with a sequential saphenous vein graft to the first diagonal branch of the LAD & then the obtuse  marginal coronary artery and the posterolateral branch of the RCA &  saphenous vein graft to the  posterior descending coronary artery   . Retinal detachment surgery Left   . Cataract  extraction w/phaco Right 02/17/2014    Procedure: CATARACT EXTRACTION PHACO AND INTRAOCULAR LENS PLACEMENT (IOC);  Surgeon: Loraine Leriche T. Nile Riggs, MD;  Location: [facility];  Service: Ophthalmology;  Laterality: Right;  CDE 10.81  . Hernia repair      x 3- 1 left and 2 on right  . Placement and suture of secondary intraocular lens Right 05/05/2014    Procedure: PLACEMENT OF SECONDARY INTRAOCULAR LENS (INSERTION OF ANTERIOR CHAMBER LENS) RIGHT EYE AND SUTURE CLOSURE;  Surgeon: Loraine Leriche T. Nile Riggs, MD;  Location: [facility];  Service: Ophthalmology;  Laterality: Right;    There were no vitals filed for this visit.      Subjective Assessment - 12/10/15 1612    Subjective Pt stated dizziness has decreased with new medication Meclizine.  Pt entered dept ambulating with 2 SPCs this session, reports no recent falls.  Stated he walks with 2 canes to assist with unsteadiness.  Reported difficulty getting to HEP due to fatigue and having so much medication to take.  No reports of pain today.   Pertinent History Rt eye has had a failed cataract surgery has limited vision in the right eye.  Pt normally holds his right eye closed.  Pt occasionally wears a patch.  HTN, DM, COPD, CABG  Currently in Pain? No/denies                Vestibular Assessment - 12/10/15 0001    Vestibulo-Occular Reflex   VOR 1 Head Only (x 1 viewing) 10x each direction   VOR 2 Head and Object (x 2 viewing) 10x each direction   Comment Verbal and visual cueing to stay on task                 [facility] Adult PT Treatment/Exercise - 12/10/15 0001    Posture/Postural Control   Posture/Postural Control Postural limitations   Postural Limitations Rounded Shoulders;Forward head;Decreased lumbar lordosis;Decreased thoracic kyphosis   Manual Therapy   Manual Therapy Soft tissue mobilization   Manual therapy comments Manual therapy complete separate rest of treatment   Soft tissue mobilization Upper traps and cervical extensors in seated  position to reduce tightness to increased cervical ROM             Balance Exercises - 12/10/15 1709    Balance Exercises: Standing   Tandem Stance Eyes open;3 reps;30 secs   SLS Eyes open;5 reps  2" max Bil with min A             PT Short Term Goals - 12/07/15 1656    PT SHORT TERM GOAL #1   Title Pt to feel confident reaching up to the top shelf without having sx of dizziness    Time 4   Period Weeks   Status New   PT SHORT TERM GOAL #2   Title Pt to be able to come sit to right side lying and right sidelying to sitting without any increased dizziness   Time 4   Period Weeks   Status New   PT SHORT TERM GOAL #3   Title PT to be independent in home habituation exercises to decrease sx of dizziness   Time 4   Period Weeks   Status New   PT SHORT TERM GOAL #4   Title Pt to be able to single leg stance for five seconds on both legs to reduce risk of falls    Time 4   Period Weeks   Status New           PT Long Term Goals - 12/07/15 1654    PT LONG TERM GOAL #1   Title Pt to be able to single leg stance for 10 seconds on both legs to reduce risk of falls    Time 8   Period Weeks   Status New   PT LONG TERM GOAL #2   Title Pt to be able to ambulate with one cane with confidence to allow one hand to be free to open doors, carry items.   Time 8   Period Weeks   Status New   PT LONG TERM GOAL #3   Title Pt to state he has had no symptoms of dizziness in the past 2 weeks to reduce risk of falls.    Time 8   Period Weeks   Status New               Plan - 12/10/15 1713    Clinical Impression Statement Reviewed goals, educated on importance of completeing HEP exercises for maximal therapy benefts and reviewed technqiue and copy of eval given to pt.  Began session reviewing technqiue with habituation exercises with verbal and visual cueing to stay on task.  Progressed to standing balance activities with min A to reduce risk of  falls with cueing to  improve posture and find focal point to improve stabilty with task.  Pt presents with forward headed and rounded shoulders posture in sitting and standing position.  Pt educated on importance of improving posture to reduce neck pain (no pain scale given this session) and improve balance. Ended session with manual soft tissue mobilization technqiues to reduce tightness in upper traps and cervical extensor musculature.  No reports of pain or dizziness through session, pt was limited by fatigue.  Pt encouraged to begin HEP exercises for maximal benefits.     Rehab Potential Good   PT Frequency 2x / week   PT Duration 4 weeks   PT Treatment/Interventions Canalith Repostioning;Functional mobility training;Therapeutic activities;Therapeutic exercise;Balance training;Neuromuscular re-education;Patient/family education;Manual techniques   PT Next Visit Plan Continue with habituatin exercises, standing balance activities including tandem stance, single leg stance, and manual on cervical to improve ROM>       Patient will benefit from skilled therapeutic intervention in order to improve the following deficits and impairments:  Abnormal gait, Decreased activity tolerance, Decreased balance, Dizziness, Difficulty walking, Decreased range of motion  Visit Diagnosis: Unsteadiness on feet  Abnormal posture  BPPV (benign paroxysmal positional vertigo), right  Muscle weakness (generalized)     Problem List Patient Active Problem List   Diagnosis Date Noted  . CAD (coronary artery disease) 03/03/2011  . A-fib (HCC) 03/03/2011  . Hyperlipemia 03/03/2011  . Hypothyroidism 03/03/2011   Becky Saxasey Kyana Aicher, LPTA; CBIS [phone]  Juel BurrowCockerham, Jim Lundin Jo 12/10/2015, 5:23 PM  [place] [facility] [address] [place], [place], [address] Phone: [phone]   Fax:  [phone]  Name: Bevelyn NgoJohn L Mohamed MRN: [id number] Date of Birth: 10/25/1922

## 2015-12-13 ENCOUNTER — Ambulatory Visit (HOSPITAL_COMMUNITY): Payer: Medicare Other | Admitting: Physical Therapy

## 2015-12-13 DIAGNOSIS — H8111 Benign paroxysmal vertigo, right ear: Secondary | ICD-10-CM

## 2015-12-13 DIAGNOSIS — R2681 Unsteadiness on feet: Secondary | ICD-10-CM | POA: Diagnosis not present

## 2015-12-13 DIAGNOSIS — R293 Abnormal posture: Secondary | ICD-10-CM

## 2015-12-13 DIAGNOSIS — M6281 Muscle weakness (generalized): Secondary | ICD-10-CM

## 2015-12-13 NOTE — Therapy (Signed)
[facility] [address] [place], [place], [address] Phone: [phone]   Fax:  [phone]  Physical Therapy Treatment  Patient Details  Name: William Chambers MRN: [id number] Date of Birth: 09-20-1922 No Data Recorded  Encounter Date: 12/13/2015      PT End of Session - 12/13/15 1023    Visit Number 3   Number of Visits 16   Date for PT Re-Evaluation 01/06/16   Authorization Type BCBS medicare   Authorization - Visit Number 3   Authorization - Number of Visits 10   PT Start Time [phone]   PT Stop Time 1036   PT Time Calculation (min) 43 min   Equipment Utilized During Treatment Gait belt   Activity Tolerance Patient tolerated treatment well      Past Medical History  Diagnosis Date  . Coronary artery disease     post CABG  . Hyperlipidemia   . Diabetes mellitus   . Hypertension   . Atrial fibrillation (HCC)   . Hypothyroidism   . Benign prostatic hypertrophy   . Gout   . Dizziness   . Anemia   . History of GI bleed   . History of angina pectoris     hypertensive   . Hiatal hernia   . Osteoarthritis   . Myocardial infarction (HCC) 1995  . Shortness of breath   . COPD (chronic obstructive pulmonary disease) [facility])     Past Surgical History  Procedure Laterality Date  . Bowel resection    . Tonsillectomy and adenoidectomy    . Coronary angioplasty with stent placement  05/1997  . Cardiac catheterization  12/21/2003    ejection fraction of approximately 65%  . Coronary artery bypass graft  01/21/2004     x6 using a left internal mammary  artery graft to the LAD coronary artery, with a saphenous vein graft to the second diagonal branch of the LAD coronary artery  with a sequential saphenous vein graft to the first diagonal branch of the LAD & then the obtuse  marginal coronary artery and the posterolateral branch of the RCA &  saphenous vein graft to the  posterior descending coronary artery   . Retinal detachment surgery  Left   . Cataract extraction w/phaco Right 02/17/2014    Procedure: CATARACT EXTRACTION PHACO AND INTRAOCULAR LENS PLACEMENT (IOC);  Surgeon: Loraine Leriche T. Nile Riggs, MD;  Location: [facility];  Service: Ophthalmology;  Laterality: Right;  CDE 10.81  . Hernia repair      x 3- 1 left and 2 on right  . Placement and suture of secondary intraocular lens Right 05/05/2014    Procedure: PLACEMENT OF SECONDARY INTRAOCULAR LENS (INSERTION OF ANTERIOR CHAMBER LENS) RIGHT EYE AND SUTURE CLOSURE;  Surgeon: Loraine Leriche T. Nile Riggs, MD;  Location: [facility];  Service: Ophthalmology;  Laterality: Right;    There were no vitals filed for this visit.      Subjective Assessment - 12/13/15 0956    Subjective Pt states that sometimes he has to side step to keep in balance.  He knows that he does not get the exercises done as often as he should    Currently in Pain? No/denies               Balance Exercises - 12/13/15 0957    Balance Exercises: Standing   Standing Eyes Opened Narrow base of support (BOS);Head turns;5 reps;Other reps (comment)  repeat 5 times slow 5 times fast both horizontal and vertica   Tandem  Stance Eyes open;Upper extremity support 1;2 reps;30 secs  with head turns    SLS Eyes open;Upper extremity support 1;5 reps   Sidestepping 2 reps   Marching Limitations x10   Sit to Stand Time 10           PT Education - 12/13/15 1022    Education provided Yes   Education Details new exercises    Person(s) Educated Patient   Methods Explanation;Demonstration   Comprehension Verbalized understanding;Returned demonstration          PT Short Term Goals - 12/07/15 1656    PT SHORT TERM GOAL #1   Title Pt to feel confident reaching up to the top shelf without having sx of dizziness    Time 4   Period Weeks   Status New   PT SHORT TERM GOAL #2   Title Pt to be able to come sit to right side lying and right sidelying to sitting without any increased dizziness   Time 4   Period Weeks   Status New    PT SHORT TERM GOAL #3   Title PT to be independent in home habituation exercises to decrease sx of dizziness   Time 4   Period Weeks   Status New   PT SHORT TERM GOAL #4   Title Pt to be able to single leg stance for five seconds on both legs to reduce risk of falls    Time 4   Period Weeks   Status New           PT Long Term Goals - 12/07/15 1654    PT LONG TERM GOAL #1   Title Pt to be able to single leg stance for 10 seconds on both legs to reduce risk of falls    Time 8   Period Weeks   Status New   PT LONG TERM GOAL #2   Title Pt to be able to ambulate with one cane with confidence to allow one hand to be free to open doors, carry items.   Time 8   Period Weeks   Status New   PT LONG TERM GOAL #3   Title Pt to state he has had no symptoms of dizziness in the past 2 weeks to reduce risk of falls.    Time 8   Period Weeks   Status New               Plan - 12/13/15 1023    Clinical Impression Statement Treatment focused on balance and head turns with therapist needing to provide moderate assistance to maintain balance.  Pt encouraged to continue eye gaze and habituation exercises at home so that we can concentrate on higher activites in treatment session.    PT Frequency 2x / week   PT Duration 4 weeks   PT Treatment/Interventions Canalith Repostioning;Functional mobility training;Therapeutic activities;Therapeutic exercise;Balance training;Neuromuscular re-education;Patient/family education;Manual techniques   PT Next Visit Plan Begin tandem walking and retro gt.       Patient will benefit from skilled therapeutic intervention in order to improve the following deficits and impairments:  Abnormal gait, Decreased activity tolerance, Decreased balance, Dizziness, Difficulty walking, Decreased range of motion  Visit Diagnosis: Unsteadiness on feet  Abnormal posture  BPPV (benign paroxysmal positional vertigo), right  Muscle weakness  (generalized)     Problem List Patient Active Problem List   Diagnosis Date Noted  . CAD (coronary artery disease) 03/03/2011  . A-fib (HCC) 03/03/2011  . Hyperlipemia 03/03/2011  .  Hypothyroidism 03/03/2011    Virgina Organ, PT CLT [phone] 12/13/2015, 10:37  AM   [facility] [address] [place], [place], [address] Phone: [phone]   Fax:  [phone]  Name: MESHACH PERRY MRN: [id number] Date of Birth: June 04, 1923

## 2015-12-17 ENCOUNTER — Ambulatory Visit (HOSPITAL_COMMUNITY): Payer: Medicare Other

## 2015-12-17 DIAGNOSIS — R293 Abnormal posture: Secondary | ICD-10-CM

## 2015-12-17 DIAGNOSIS — R2681 Unsteadiness on feet: Secondary | ICD-10-CM

## 2015-12-17 DIAGNOSIS — M6281 Muscle weakness (generalized): Secondary | ICD-10-CM

## 2015-12-17 NOTE — Therapy (Signed)
[place] [facility] [address] [place], [place], [address] Phone: [phone]   Fax:  [phone]  Physical Therapy Treatment  Patient Details  Name: William Chambers MRN: [id number] Date of Birth: 1923/05/06 No Data Recorded  Encounter Date: 12/17/2015      PT End of Session - 12/17/15 1521    Visit Number 4   Number of Visits 16   Date for PT Re-Evaluation 01/06/16   Authorization Type BCBS medicare   Authorization - Visit Number 4   Authorization - Number of Visits 10   PT Start Time 1518   PT Stop Time 1601   PT Time Calculation (min) 43 min   Equipment Utilized During Treatment Gait belt   Activity Tolerance Patient tolerated treatment well   Behavior During Therapy [facility] for tasks assessed/performed      Past Medical History  Diagnosis Date  . Coronary artery disease     post CABG  . Hyperlipidemia   . Diabetes mellitus   . Hypertension   . Atrial fibrillation (HCC)   . Hypothyroidism   . Benign prostatic hypertrophy   . Gout   . Dizziness   . Anemia   . History of GI bleed   . History of angina pectoris     hypertensive   . Hiatal hernia   . Osteoarthritis   . Myocardial infarction (HCC) 1995  . Shortness of breath   . COPD (chronic obstructive pulmonary disease) [facility])     Past Surgical History  Procedure Laterality Date  . Bowel resection    . Tonsillectomy and adenoidectomy    . Coronary angioplasty with stent placement  05/1997  . Cardiac catheterization  12/21/2003    ejection fraction of approximately 65%  . Coronary artery bypass graft  01/21/2004     x6 using a left internal mammary  artery graft to the LAD coronary artery, with a saphenous vein graft to the second diagonal branch of the LAD coronary artery  with a sequential saphenous vein graft to the first diagonal branch of the LAD & then the obtuse  marginal coronary artery and the posterolateral branch of the RCA &  saphenous vein graft to the   posterior descending coronary artery   . Retinal detachment surgery Left   . Cataract extraction w/phaco Right 02/17/2014    Procedure: CATARACT EXTRACTION PHACO AND INTRAOCULAR LENS PLACEMENT (IOC);  Surgeon: Loraine Leriche T. Nile Riggs, MD;  Location: [facility];  Service: Ophthalmology;  Laterality: Right;  CDE 10.81  . Hernia repair      x 3- 1 left and 2 on right  . Placement and suture of secondary intraocular lens Right 05/05/2014    Procedure: PLACEMENT OF SECONDARY INTRAOCULAR LENS (INSERTION OF ANTERIOR CHAMBER LENS) RIGHT EYE AND SUTURE CLOSURE;  Surgeon: Loraine Leriche T. Nile Riggs, MD;  Location: [facility];  Service: Ophthalmology;  Laterality: Right;    There were no vitals filed for this visit.      Subjective Assessment - 12/17/15 1520    Subjective Pt stated he has decreased energy following lunch today.  No reports of pain.  He knows that he is not doing the exercises as often as he should.   Pertinent History Rt eye has had a failed cataract surgery has limited vision in the right eye.  Pt normally holds his right eye closed.  Pt occasionally wears a patch.  HTN, DM, COPD, CABG    Currently in Pain? No/denies  Vestibular Treatment/Exercise - 12/17/15 0001    Vestibular Treatment/Exercise   Habituation Exercises Seated Horizontal Head Turns;Seated Vertical Head Turns   Seated Horizontal Head Turns   Number of Reps  10   Seated Vertical Head Turns   Number of Reps  10            Balance Exercises - 12/17/15 1529    Balance Exercises: Standing   Standing Eyes Opened Narrow base of support (BOS);Head turns;5 reps;Other reps (comment)  repeated 5 times slow then 5 times faster   Tandem Stance Eyes open;2 reps;30 secs  no UE A; mod A   SLS Eyes open;Upper extremity support 1;5 reps   Tandem Gait 1 rep   Retro Gait 1 rep   Sidestepping 2 reps   Marching Limitations 10x 3" holds no UEA   Sit to Stand Time 10             PT Short Term Goals - 12/07/15 1656    PT  SHORT TERM GOAL #1   Title Pt to feel confident reaching up to the top shelf without having sx of dizziness    Time 4   Period Weeks   Status New   PT SHORT TERM GOAL #2   Title Pt to be able to come sit to right side lying and right sidelying to sitting without any increased dizziness   Time 4   Period Weeks   Status New   PT SHORT TERM GOAL #3   Title PT to be independent in home habituation exercises to decrease sx of dizziness   Time 4   Period Weeks   Status New   PT SHORT TERM GOAL #4   Title Pt to be able to single leg stance for five seconds on both legs to reduce risk of falls    Time 4   Period Weeks   Status New           PT Long Term Goals - 12/07/15 1654    PT LONG TERM GOAL #1   Title Pt to be able to single leg stance for 10 seconds on both legs to reduce risk of falls    Time 8   Period Weeks   Status New   PT LONG TERM GOAL #2   Title Pt to be able to ambulate with one cane with confidence to allow one hand to be free to open doors, carry items.   Time 8   Period Weeks   Status New   PT LONG TERM GOAL #3   Title Pt to state he has had no symptoms of dizziness in the past 2 weeks to reduce risk of falls.    Time 8   Period Weeks   Status New               Plan - 12/17/15 1609    Clinical Impression Statement Treatment focus on improving balance and habituation eye gaze exercises.  Progressed to tandem and retro gait with moderate assistance required to maintain balance.  Multimodal cueing to improve focus iwth habituation exercises, encouraged pt to increase frequency with HEP exercises so we can concentracte on higher level balance activities with treatment.  Pt required multiple rest breaks due to fatigue and stateing "his leg were giving out".  No reports of pain through session.     Rehab Potential Good   PT Frequency 2x / week   PT Duration 4 weeks   PT Treatment/Interventions Canalith Repostioning;Functional mobility  training;Therapeutic  activities;Therapeutic exercise;Balance training;Neuromuscular re-education;Patient/family education;Manual techniques   PT Next Visit Plan Continue balance training, begin gaze habitation exercises on foam when ready, progress balance.        Patient will benefit from skilled therapeutic intervention in order to improve the following deficits and impairments:  Abnormal gait, Decreased activity tolerance, Decreased balance, Dizziness, Difficulty walking, Decreased range of motion  Visit Diagnosis: Unsteadiness on feet  Abnormal posture  Muscle weakness (generalized)     Problem List Patient Active Problem List   Diagnosis Date Noted  . CAD (coronary artery disease) 03/03/2011  . A-fib (HCC) 03/03/2011  . Hyperlipemia 03/03/2011  . Hypothyroidism 03/03/2011   Becky Sax, LPTA; CBIS [phone]  Juel Burrow 12/17/2015, 4:31 PM   [facility] [address] [place], [place], [address] Phone: [phone]   Fax:  [phone]  Name: William Chambers MRN: [id number] Date of Birth: [birth year]

## 2015-12-20 ENCOUNTER — Ambulatory Visit (HOSPITAL_COMMUNITY): Payer: Medicare Other | Admitting: Physical Therapy

## 2015-12-20 DIAGNOSIS — R293 Abnormal posture: Secondary | ICD-10-CM

## 2015-12-20 DIAGNOSIS — R2681 Unsteadiness on feet: Secondary | ICD-10-CM

## 2015-12-20 DIAGNOSIS — M6281 Muscle weakness (generalized): Secondary | ICD-10-CM

## 2015-12-20 DIAGNOSIS — H8111 Benign paroxysmal vertigo, right ear: Secondary | ICD-10-CM

## 2015-12-20 NOTE — Therapy (Signed)
[place] [facility] [address] [place], [place], [address] Phone: [phone]   Fax:  [phone]  Physical Therapy Treatment  Patient Details  Name: William Chambers MRN: [id number] Date of Birth: [birth year] No Data Recorded  Encounter Date: 12/20/2015      PT End of Session - 12/20/15 1607    Visit Number 5   Number of Visits 16   Date for PT Re-Evaluation 01/06/16   Authorization Type BCBS medicare   Authorization - Visit Number 5   Authorization - Number of Visits 10   PT Start Time 1520   PT Stop Time 1608   PT Time Calculation (min) 48 min   Equipment Utilized During Treatment Gait belt   Activity Tolerance Patient tolerated treatment well   Behavior During Therapy [facility] for tasks assessed/performed      Past Medical History  Diagnosis Date  . Coronary artery disease     post CABG  . Hyperlipidemia   . Diabetes mellitus   . Hypertension   . Atrial fibrillation (HCC)   . Hypothyroidism   . Benign prostatic hypertrophy   . Gout   . Dizziness   . Anemia   . History of GI bleed   . History of angina pectoris     hypertensive   . Hiatal hernia   . Osteoarthritis   . Myocardial infarction (HCC) 1995  . Shortness of breath   . COPD (chronic obstructive pulmonary disease) [facility])     Past Surgical History  Procedure Laterality Date  . Bowel resection    . Tonsillectomy and adenoidectomy    . Coronary angioplasty with stent placement  05/1997  . Cardiac catheterization  12/21/2003    ejection fraction of approximately 65%  . Coronary artery bypass graft  01/21/2004     x6 using a left internal mammary  artery graft to the LAD coronary artery, with a saphenous vein graft to the second diagonal branch of the LAD coronary artery  with a sequential saphenous vein graft to the first diagonal branch of the LAD & then the obtuse  marginal coronary artery and the posterolateral branch of the RCA &  saphenous vein graft to the   posterior descending coronary artery   . Retinal detachment surgery Left   . Cataract extraction w/phaco Right 02/17/2014    Procedure: CATARACT EXTRACTION PHACO AND INTRAOCULAR LENS PLACEMENT (IOC);  Surgeon: Loraine Leriche T. Nile Riggs, MD;  Location: [facility];  Service: Ophthalmology;  Laterality: Right;  CDE 10.81  . Hernia repair      x 3- 1 left and 2 on right  . Placement and suture of secondary intraocular lens Right 05/05/2014    Procedure: PLACEMENT OF SECONDARY INTRAOCULAR LENS (INSERTION OF ANTERIOR CHAMBER LENS) RIGHT EYE AND SUTURE CLOSURE;  Surgeon: Loraine Leriche T. Nile Riggs, MD;  Location: [facility];  Service: Ophthalmology;  Laterality: Right;    There were no vitals filed for this visit.      Subjective Assessment - 12/20/15 1549    Subjective Pt states he woke at 5:30 this morning with nausea and had dry heaves.  States he was able to get his meclizine down and currently feels bettler.  States he is not hurting anywhere. Pt states his COPD is also acting up.  Reports he almost called to cancel appointment.  Balance Exercises - 12/20/15 1533    Balance Exercises: Standing   Standing Eyes Opened Narrow base of support (BOS);Head turns;5 reps;Other reps (comment)   SLS with Vectors 5 reps;Upper extremity assist 1;Limitations  5 seconds   Retro Gait 2 reps   Sidestepping 2 reps   Sit to Stand Time 10   Other Standing Exercises forward lunge onto 4" step no UE assist 10 reps each             PT Short Term Goals - 12/07/15 1656    PT SHORT TERM GOAL #1   Title Pt to feel confident reaching up to the top shelf without having sx of dizziness    Time 4   Period Weeks   Status New   PT SHORT TERM GOAL #2   Title Pt to be able to come sit to right side lying and right sidelying to sitting without any increased dizziness   Time 4   Period Weeks   Status New   PT SHORT TERM GOAL #3   Title PT to be independent in home habituation exercises  to decrease sx of dizziness   Time 4   Period Weeks   Status New   PT SHORT TERM GOAL #4   Title Pt to be able to single leg stance for five seconds on both legs to reduce risk of falls    Time 4   Period Weeks   Status New           PT Long Term Goals - 12/07/15 1654    PT LONG TERM GOAL #1   Title Pt to be able to single leg stance for 10 seconds on both legs to reduce risk of falls    Time 8   Period Weeks   Status New   PT LONG TERM GOAL #2   Title Pt to be able to ambulate with one cane with confidence to allow one hand to be free to open doors, carry items.   Time 8   Period Weeks   Status New   PT LONG TERM GOAL #3   Title Pt to state he has had no symptoms of dizziness in the past 2 weeks to reduce risk of falls.    Time 8   Period Weeks   Status New               Plan - 12/20/15 1609    Clinical Impression Statement focused on static balance with head and eye movement and dynamic balance with and without UE assist.  Pt mostly unsteady upon initial standing but able to complete retro and side stepping without LOB or staggering gait.  Attmepted tandem gait, however unable to complete this task due to dizziness and instability.  Pt required seated rest breaks between each activity of 1 minute.  Added vector stance with 1 HHA and 3-5" second holds and forward lunges onto 4" step without UE assist to further improve strength in glutes and LE.  PT without c/o nausea or dizziness throughout session and able to complete full session.     Rehab Potential Good   PT Frequency 2x / week   PT Duration 4 weeks   PT Treatment/Interventions Canalith Repostioning;Functional mobility training;Therapeutic activities;Therapeutic exercise;Balance training;Neuromuscular re-education;Patient/family education;Manual techniques   PT Next Visit Plan Continue balance training, begin gaze habitation exercises on foam when ready, progress balance.        Patient will benefit from  skilled therapeutic intervention in order to  improve the following deficits and impairments:  Abnormal gait, Decreased activity tolerance, Decreased balance, Dizziness, Difficulty walking, Decreased range of motion  Visit Diagnosis: Unsteadiness on feet  Abnormal posture  Muscle weakness (generalized)  BPPV (benign paroxysmal positional vertigo), right     Problem List Patient Active Problem List   Diagnosis Date Noted  . CAD (coronary artery disease) 03/03/2011  . A-fib (HCC) 03/03/2011  . Hyperlipemia 03/03/2011  . Hypothyroidism 03/03/2011    Lurena Nida, PTA/CLT [phone]  12/20/2015, 4:20 PM  [place] [facility] [address] [place], [place], [address] Phone: [phone]   Fax:  [phone]  Name: William Chambers MRN: [id number] Date of Birth: 1922/11/06

## 2015-12-24 ENCOUNTER — Ambulatory Visit (HOSPITAL_COMMUNITY): Payer: Medicare Other

## 2015-12-24 DIAGNOSIS — R293 Abnormal posture: Secondary | ICD-10-CM

## 2015-12-24 DIAGNOSIS — M6281 Muscle weakness (generalized): Secondary | ICD-10-CM

## 2015-12-24 DIAGNOSIS — R2681 Unsteadiness on feet: Secondary | ICD-10-CM

## 2015-12-24 NOTE — Therapy (Addendum)
[facility] [address] [place], [place], [address] Phone: [phone]   Fax:  [phone]  Physical Therapy Treatment  Patient Details  Name: William Chambers MRN: [id number] Date of Birth: Jan 20, 1923 No Data Recorded  Encounter Date: 12/24/2015      PT End of Session - 12/24/15 1100    Visit Number 6   Number of Visits 16   Date for PT Re-Evaluation 01/06/16   Authorization Type [employer] medicare   Authorization - Visit Number 6   Authorization - Number of Visits 10   PT Start Time 5397   PT Stop Time 1116   PT Time Calculation (min) 41 min   Equipment Utilized During Treatment Gait belt   Activity Tolerance Patient tolerated treatment well;Patient limited by fatigue   Behavior During Therapy [facility] for tasks assessed/performed      Past Medical History  Diagnosis Date  . Coronary artery disease     post CABG  . Hyperlipidemia   . Diabetes mellitus   . Hypertension   . Atrial fibrillation ([place])   . Hypothyroidism   . Benign prostatic hypertrophy   . Gout   . Dizziness   . Anemia   . History of GI bleed   . History of angina pectoris     hypertensive   . Hiatal hernia   . Osteoarthritis   . Myocardial infarction ([place]) 1995  . Shortness of breath   . COPD (chronic obstructive pulmonary disease) [facility])     Past Surgical History  Procedure Laterality Date  . Bowel resection    . Tonsillectomy and adenoidectomy    . Coronary angioplasty with stent placement  05/1997  . Cardiac catheterization  12/21/2003    ejection fraction of approximately 65%  . Coronary artery bypass graft  01/21/2004     x6 using a left internal mammary  artery graft to the LAD coronary artery, with a saphenous vein graft to the second diagonal branch of the LAD coronary artery  with a sequential saphenous vein graft to the first diagonal branch of the LAD & then the obtuse  marginal coronary artery and the posterolateral branch of the RCA &  saphenous  vein graft to the  posterior descending coronary artery   . Retinal detachment surgery Left   . Cataract extraction w/phaco Right 02/17/2014    Procedure: CATARACT EXTRACTION PHACO AND INTRAOCULAR LENS PLACEMENT (IOC);  Surgeon: Elta Guadeloupe T. Gershon Crane, MD;  Location: [facility];  Service: Ophthalmology;  Laterality: Right;  CDE 10.81  . Hernia repair      x 3- 1 left and 2 on right  . Placement and suture of secondary intraocular lens Right 05/05/2014    Procedure: PLACEMENT OF SECONDARY INTRAOCULAR LENS (INSERTION OF ANTERIOR CHAMBER LENS) RIGHT EYE AND SUTURE CLOSURE;  Surgeon: Elta Guadeloupe T. Gershon Crane, MD;  Location: [facility];  Service: Ophthalmology;  Laterality: Right;    There were no vitals filed for this visit.      Subjective Assessment - 12/24/15 1040    Subjective No reports of recent dizziness, reports he has leak in kitchen and has been placing news papers on floor for leaking, reports increased difficulty picking up the paper.  reports compliane with gaze exercises daily.   Pertinent History Rt eye has had a failed cataract surgery has limited vision in the right eye.  Pt normally holds his right eye closed.  Pt occasionally wears a patch.  HTN, DM, COPD, CABG    Currently in  Pain? No/denies             Vestibular Treatment/Exercise - 12/24/15 0001    Vestibular Treatment/Exercise   Habituation Exercises Standing Horizontal Head Turns;Standing Vertical Head Turns;Standing Diagonal Head Turns   Standing Vertical Head Turns   Number of Reps  --  3 sets x 10 reps 1) static floor, 2) NBOS of foam, 3) tandem   Standing Diagonal Head Turns   Number of Reps  --  3 sets x 10 reps 1) static floor, 2) NBOS of foam, 3) tandem   X1 Viewing Horizontal   Foot Position 3 sets x 10 reps 1) static floor, 2) NBOS of foam, 3) tandem stannce of foam   X1 Viewing Vertical   Foot Position 3 sets x 10 reps 1) static floor, 2) NBOS of foam, 3) tandem stannce of foam            Balance Exercises - 12/24/15  1115    Balance Exercises: Standing   Standing Eyes Opened Narrow base of support (BOS);Foam/compliant surface;30 secs;2 reps   Tandem Stance Eyes open;Foam/compliant surface;2 reps;30 secs  incorporated gaze habitation exercises   SLS with Vectors 5 reps;Upper extremity assist 1;Limitations   Tandem Gait 1 rep   Retro Gait 2 reps   Sidestepping 2 reps;Theraband  RTB   Sit to Stand Time 10             PT Short Term Goals - 12/07/15 1656    PT SHORT TERM GOAL #1   Title Pt to feel confident reaching up to the top shelf without having sx of dizziness    Time 4   Period Weeks   Status New   PT SHORT TERM GOAL #2   Title Pt to be able to come sit to right side lying and right sidelying to sitting without any increased dizziness   Time 4   Period Weeks   Status New   PT SHORT TERM GOAL #3   Title PT to be independent in home habituation exercises to decrease sx of dizziness   Time 4   Period Weeks   Status New   PT SHORT TERM GOAL #4   Title Pt to be able to single leg stance for five seconds on both legs to reduce risk of falls    Time 4   Period Weeks   Status New           PT Long Term Goals - 12/07/15 1654    PT LONG TERM GOAL #1   Title Pt to be able to single leg stance for 10 seconds on both legs to reduce risk of falls    Time 8   Period Weeks   Status New   PT LONG TERM GOAL #2   Title Pt to be able to ambulate with one cane with confidence to allow one hand to be free to open doors, carry items.   Time 8   Period Weeks   Status New   PT LONG TERM GOAL #3   Title Pt to state he has had no symptoms of dizziness in the past 2 weeks to reduce risk of falls.    Time 8   Period Weeks   Status New               Plan - 12/24/15 1225    Clinical Impression Statement Focus on improving balance with gaze habitation eye and head movements and dynamic balance training.  Added foam with  NBOS and tandem stance with gaze habitation exercises this session  for balance training with min A and intermittent UE A required.  Added theraband with sidestepping for glut med strengtheing during balance training.  Pt improving activity tolerance with less seated rest breaks required for shorter duration this session.  End of session pt limited by faitigue, no reports of pain or dizziness thorugh session.     Rehab Potential Good   PT Frequency 2x / week   PT Duration 4 weeks   PT Treatment/Interventions Canalith Repostioning;Functional mobility training;Therapeutic activities;Therapeutic exercise;Balance training;Neuromuscular re-education;Patient/family education;Manual techniques   PT Next Visit Plan Continue progressing gaze habitation exercises on dynamic surfaces then wtih gait, progress balance.        Patient will benefit from skilled therapeutic intervention in order to improve the following deficits and impairments:  Abnormal gait, Decreased activity tolerance, Decreased balance, Dizziness, Difficulty walking, Decreased range of motion  Visit Diagnosis: Unsteadiness on feet  Abnormal posture  Muscle weakness (generalized)     Problem List Patient Active Problem List   Diagnosis Date Noted  . CAD (coronary artery disease) 03/03/2011  . A-fib ([place]) 03/03/2011  . Hyperlipemia 03/03/2011  . Hypothyroidism 03/03/2011   [facility] [place], [place]; [place]    [facility] [address] [place], [place], [address] Phone: [phone]   Fax:  [phone]  Name: William Chambers MRN: [id number] Date of Birth: 07-02-1923    PHYSICAL THERAPY DISCHARGE SUMMARY  Visits from Start of Care: 6  Current functional level related to goals / functional outcomes: Pt aspirated on a pill and feels that he is to weak to come to therapy   Remaining deficits: Balance    Education / Equipment: HEP  Plan: Patient agrees to discharge.  Patient goals were partially met. Patient is being discharged due  to a change in medical status.  ?????       Rayetta Humphrey, [place] CLT [phone]

## 2015-12-27 ENCOUNTER — Ambulatory Visit (HOSPITAL_COMMUNITY): Payer: Medicare Other | Admitting: Physical Therapy

## 2015-12-27 ENCOUNTER — Ambulatory Visit (HOSPITAL_COMMUNITY): Payer: Medicare Other | Admitting: Specialist

## 2015-12-27 ENCOUNTER — Telehealth (HOSPITAL_COMMUNITY): Payer: Self-pay

## 2015-12-27 NOTE — Telephone Encounter (Signed)
12/27/15 therapist unavilable this week so patient said to go ahead and cancel this week.  He will call back once he talks with his driver and we will reschedule

## 2015-12-28 ENCOUNTER — Emergency Department (HOSPITAL_COMMUNITY): Payer: Medicare Other

## 2015-12-28 ENCOUNTER — Encounter (HOSPITAL_COMMUNITY): Payer: Self-pay | Admitting: *Deleted

## 2015-12-28 DIAGNOSIS — M109 Gout, unspecified: Secondary | ICD-10-CM | POA: Diagnosis not present

## 2015-12-28 DIAGNOSIS — Y9289 Other specified places as the place of occurrence of the external cause: Secondary | ICD-10-CM | POA: Diagnosis not present

## 2015-12-28 DIAGNOSIS — J449 Chronic obstructive pulmonary disease, unspecified: Secondary | ICD-10-CM | POA: Insufficient documentation

## 2015-12-28 DIAGNOSIS — E119 Type 2 diabetes mellitus without complications: Secondary | ICD-10-CM | POA: Diagnosis not present

## 2015-12-28 DIAGNOSIS — I2510 Atherosclerotic heart disease of native coronary artery without angina pectoris: Secondary | ICD-10-CM | POA: Insufficient documentation

## 2015-12-28 DIAGNOSIS — M1990 Unspecified osteoarthritis, unspecified site: Secondary | ICD-10-CM | POA: Insufficient documentation

## 2015-12-28 DIAGNOSIS — Z7951 Long term (current) use of inhaled steroids: Secondary | ICD-10-CM | POA: Insufficient documentation

## 2015-12-28 DIAGNOSIS — Y9389 Activity, other specified: Secondary | ICD-10-CM | POA: Diagnosis not present

## 2015-12-28 DIAGNOSIS — E785 Hyperlipidemia, unspecified: Secondary | ICD-10-CM | POA: Diagnosis not present

## 2015-12-28 DIAGNOSIS — Z9889 Other specified postprocedural states: Secondary | ICD-10-CM | POA: Diagnosis not present

## 2015-12-28 DIAGNOSIS — Z862 Personal history of diseases of the blood and blood-forming organs and certain disorders involving the immune mechanism: Secondary | ICD-10-CM | POA: Insufficient documentation

## 2015-12-28 DIAGNOSIS — Z7982 Long term (current) use of aspirin: Secondary | ICD-10-CM | POA: Insufficient documentation

## 2015-12-28 DIAGNOSIS — Z791 Long term (current) use of non-steroidal anti-inflammatories (NSAID): Secondary | ICD-10-CM | POA: Insufficient documentation

## 2015-12-28 DIAGNOSIS — X58XXXA Exposure to other specified factors, initial encounter: Secondary | ICD-10-CM | POA: Insufficient documentation

## 2015-12-28 DIAGNOSIS — E039 Hypothyroidism, unspecified: Secondary | ICD-10-CM | POA: Diagnosis not present

## 2015-12-28 DIAGNOSIS — N400 Enlarged prostate without lower urinary tract symptoms: Secondary | ICD-10-CM | POA: Insufficient documentation

## 2015-12-28 DIAGNOSIS — Z79899 Other long term (current) drug therapy: Secondary | ICD-10-CM | POA: Diagnosis not present

## 2015-12-28 DIAGNOSIS — I252 Old myocardial infarction: Secondary | ICD-10-CM | POA: Insufficient documentation

## 2015-12-28 DIAGNOSIS — Z87891 Personal history of nicotine dependence: Secondary | ICD-10-CM | POA: Insufficient documentation

## 2015-12-28 DIAGNOSIS — I10 Essential (primary) hypertension: Secondary | ICD-10-CM | POA: Insufficient documentation

## 2015-12-28 DIAGNOSIS — Z794 Long term (current) use of insulin: Secondary | ICD-10-CM | POA: Insufficient documentation

## 2015-12-28 DIAGNOSIS — Z7984 Long term (current) use of oral hypoglycemic drugs: Secondary | ICD-10-CM | POA: Diagnosis not present

## 2015-12-28 DIAGNOSIS — T189XXA Foreign body of alimentary tract, part unspecified, initial encounter: Secondary | ICD-10-CM | POA: Diagnosis not present

## 2015-12-28 DIAGNOSIS — Y998 Other external cause status: Secondary | ICD-10-CM | POA: Diagnosis not present

## 2015-12-28 NOTE — ED Notes (Signed)
The pt swallowed a pill still in the plastic foil package  He now feels it in his abd

## 2015-12-29 ENCOUNTER — Emergency Department (HOSPITAL_COMMUNITY): Payer: Medicare Other

## 2015-12-29 ENCOUNTER — Emergency Department (HOSPITAL_COMMUNITY)
Admission: EM | Admit: 2015-12-29 | Discharge: 2015-12-29 | Disposition: A | Discharge status: Home / Self Care | Payer: Medicare Other | Attending: Emergency Medicine | Admitting: Emergency Medicine

## 2015-12-29 DIAGNOSIS — R109 Unspecified abdominal pain: Secondary | ICD-10-CM

## 2015-12-29 DIAGNOSIS — T189XXA Foreign body of alimentary tract, part unspecified, initial encounter: Secondary | ICD-10-CM

## 2015-12-29 MED ORDER — GI COCKTAIL ~~LOC~~
30.0000 mL | Freq: Once | ORAL | Status: AC
  Administered 2015-12-29: 30 mL via ORAL
  Filled 2015-12-29: qty 30

## 2015-12-29 NOTE — ED Notes (Signed)
The pt was discussed with dr Jeraldine Lootslockwood  He ordered a flat and upright of the abd

## 2015-12-29 NOTE — Discharge Instructions (Signed)
You were seen today for a swallowed foreign body. Your workup is reassuring. You may have some irritation of your esophagus or stomach because of this. The foreign body was small enough it should pass on its own. If you develop worsening pain, nausea, vomiting, or difficulty having bowel movements, you need to be reevaluated immediately.  Swallowed Foreign Body, Adult A swallowed foreign body is an object that gets stuck in the tube that connects your throat to your stomach (esophagus) or in another part of your digestive tract. Foreign bodies may be swallowed by accident or on purpose. When you swallow an object, it passes into your esophagus. The narrowest place in your digestive system is where your esophagus meets your stomach. If the object can pass through that place, it will usually continue through the rest of your digestive system without causing problems. A foreign body that gets stuck may need to be removed. It is very important to tell your health care provider what you have swallowed. Certain swallowed items can be life-threatening. You may need emergency treatment. Dangerous swallowed foreign bodies include:  Objects that get stuck in your throat.  Objects that interfere with your breathing.  Sharp objects.  Harmful objects, such as batteries or illegal drugs. CAUSES The most common swallowed foreign body is food that will not pass through your esophagus to your stomach (food impaction). Foods that commonly become impacted include meats and hard vegetables, such as carrots and radishes. Other common swallowed foreign bodies include:  Pieces of bone from meats.  Toothpicks.  Dentures. RISK FACTORS You are more likely to have a swallowed foreign body if:  You wear dentures.  You have been drinking alcohol or taking drugs.  You have a mental health condition.  You have a narrowed or scarred area in your digestive tract. SYMPTOMS  Pain or pressure in your throat or  chest.  Not being able to swallow food or liquid.  Not being able to swallow your saliva.  Choking.  Hoarse voice.  Trouble breathing. DIAGNOSIS This condition may be diagnosed based on your symptoms and medical history. Your health care provider will do a physical exam to confirm the diagnosis and to find the object. Imaging studies may be done, including:  X-rays.  A CT scan. Some objects may not be seen on imaging studies. In those cases, an exam may be done using a long tubelike scope to look into your esophagus (endoscopy). The tube (endoscope) that is used for this exam may be stiff (rigid) or flexible, depending on where the foreign body is stuck. TREATMENT Usually, an object that has passed into your stomach but is not dangerous will pass out of your digestive system without treatment. If the swallowed object is not dangerous but it is stuck in your esophagus:  You may be given medicine to relax the muscles of your esophagus to allow the object to pass through.  Endoscopy may be done to find and remove the object if it does not pass with medicine. Your health care provider will put medical instruments through the endoscope to remove the object. You may need emergency treatment if:  The object is in your esophagus and is causing you to inhale saliva into your lungs (aspirate).  The object is in your esophagus and is pressing on your airway. This makes it hard for you to breathe.  The object can damage your digestive tract. Some objects that can cause damage include batteries, magnets, sharp objects, and drugs. HOME CARE INSTRUCTIONS  If the object in your digestive system is expected to pass:  Continue eating what you usually eat, unless your health care provider gives you different instructions.  Check your stool after every bowel movement to see if you have passed the object.  Contact your health care provider if the object has not passed after 3 days. If you had  endoscopic surgery to remove the foreign body:  Follow instructions from your health care provider about caring for yourself after the procedure. Keep all follow-up visits and repeat imaging tests as told by your health care provider. This is important. SEEK MEDICAL CARE IF:  You continue to have symptoms of a swallowed foreign body.  The object has not passed out of your body after 3 days. SEEK IMMEDIATE MEDICAL CARE IF:  You have a fever.  You have pain in your chest or your abdomen.  You cough up blood.  You have blood in your stool (feces) or your vomit.   This information is not intended to replace advice given to you by your health care provider. Make sure you discuss any questions you have with your health care provider.   Document Released: 02/01/2010 Document Revised: 05/05/2015 Document Reviewed: 11/11/2014 Elsevier Interactive Patient Education [employer]

## 2015-12-29 NOTE — ED Notes (Signed)
Patient transported to X-ray 

## 2015-12-29 NOTE — ED Provider Notes (Signed)
CSN: [id number]     Arrival date & time 12/28/15  2241 History  By signing my name below, I, Marisue HumbleMichelle Chaffee, attest that this documentation has been prepared under the direction and in the presence of Shon Batonourtney F Hatcher Froning, MD . Electronically Signed: Marisue HumbleMichelle Chaffee, Scribe. 12/29/2015. 4:53 AM.    Chief Complaint  Patient presents with  . Swallowed Foreign Body   The history is provided by the patient. No language interpreter was used.   HPI Comments:  William Chambers is a 82 y.o. male with PMHx of CAD, DM, MI, HTN, HLD and COPD who presents to the Emergency Department via EMS s/p accidentally swallowing a pill in a plastic foil package yesterday evening.Patient reports that he put all of his pills in a bottle. He forgotten that he had not taken off the plastic fall off of one of his pills. He took all of his pills at one time. He then realized that he likely swallowed the pill in the plastic foil. He feels like the foreign body is stuck in his upper abdomen and he reports intermittent pain in that area. Currently pain-free.  Pain is moderately alleviated when lying flat; he ate a slice of bread and drank fluids with some relief. Pt reports associated mild sore throat. Denies vomiting.  Past Medical History  Diagnosis Date  . Coronary artery disease     post CABG  . Hyperlipidemia   . Diabetes mellitus   . Hypertension   . Atrial fibrillation ([facility])   . Hypothyroidism   . Benign prostatic hypertrophy   . Gout   . Dizziness   . Anemia   . History of GI bleed   . History of angina pectoris     hypertensive   . Hiatal hernia   . Osteoarthritis   . Myocardial infarction ([facility]) 1995  . Shortness of breath   . COPD (chronic obstructive pulmonary disease) [facility])    Past Surgical History  Procedure Laterality Date  . Bowel resection    . Tonsillectomy and adenoidectomy    . Coronary angioplasty with stent placement  05/1997  . Cardiac catheterization  12/21/2003    ejection fraction of  approximately 65%  . Coronary artery bypass graft  01/21/2004     x6 using a left internal mammary  artery graft to the LAD coronary artery, with a saphenous vein graft to the second diagonal branch of the LAD coronary artery  with a sequential saphenous vein graft to the first diagonal branch of the LAD & then the obtuse  marginal coronary artery and the posterolateral branch of the RCA &  saphenous vein graft to the  posterior descending coronary artery   . Retinal detachment surgery Left   . Cataract extraction w/phaco Right 02/17/2014    Procedure: CATARACT EXTRACTION PHACO AND INTRAOCULAR LENS PLACEMENT (IOC);  Surgeon: Loraine LericheMark T. Nile RiggsShapiro, MD;  Location: [facility];  Service: Ophthalmology;  Laterality: Right;  CDE 10.81  . Hernia repair      x 3- 1 left and 2 on right  . Placement and suture of secondary intraocular lens Right 05/05/2014    Procedure: PLACEMENT OF SECONDARY INTRAOCULAR LENS (INSERTION OF ANTERIOR CHAMBER LENS) RIGHT EYE AND SUTURE CLOSURE;  Surgeon: Loraine LericheMark T. Nile RiggsShapiro, MD;  Location: [facility];  Service: Ophthalmology;  Laterality: Right;   Family History  Problem Relation Age of Onset  . Heart attack Father   . Coronary artery disease Father   . Hypertension Mother   . Cancer Mother   .  Obesity Child    Social History  Substance Use Topics  . Smoking status: Former Smoker -- 1.00 packs/day for 55 years    Types: Cigarettes    Quit date: 08/29/1995  . Smokeless tobacco: None  . Alcohol Use: Yes     Comment: history of alcohol abuse 15-20 years ago    Review of Systems  Constitutional: Negative for fever.  HENT: Positive for sore throat.        Swallowed Foreign Body  Gastrointestinal: Positive for abdominal pain. Negative for nausea, vomiting and constipation.  All other systems reviewed and are negative.  Allergies  Shellfish allergy and Grapeseed extract  Home Medications   Prior to Admission medications   Medication Sig Start Date End Date Taking? Authorizing  Provider  acetaminophen (TYLENOL) 500 MG tablet Take 1,000 mg by mouth daily as needed for moderate pain.    Historical Provider, MD  allopurinol (ZYLOPRIM) 300 MG tablet Take 300 mg by mouth daily.     Historical Provider, MD  aspirin 81 MG tablet Take 81 mg by mouth daily.      Historical Provider, MD  atorvastatin (LIPITOR) 40 MG tablet Take 40 mg by mouth at bedtime.  08/22/13   Historical Provider, MD  benazepril (LOTENSIN) 10 MG tablet Take 1 tablet (10 mg total) by mouth daily. 10/06/15   Vesta Mixer, MD  Calcium Carbonate-Vitamin D (CALCIUM 600+D) 600-200 MG-UNIT TABS Take 2 tablets by mouth 2 (two) times daily.    Historical Provider, MD  docusate sodium (COLACE) 100 MG capsule Take 300 mg by mouth at bedtime as needed for moderate constipation.     Historical Provider, MD  famotidine (PEPCID) 20 MG tablet Take 20 mg by mouth daily.    Historical Provider, MD  fenofibrate (TRICOR) 145 MG tablet Take 1 tablet (145 mg total) by mouth daily. 09/13/11   Vesta Mixer, MD  finasteride (PROSCAR) 5 MG tablet Take 5 mg by mouth daily.      Historical Provider, MD  fluticasone (FLONASE) 50 MCG/ACT nasal spray Place 1 spray into both nostrils daily.    Historical Provider, MD  fluticasone-salmeterol (ADVAIR HFA) 115-21 MCG/ACT inhaler Inhale 2 puffs into the lungs 2 (two) times daily.    Historical Provider, MD  guaifenesin (HUMIBID E) 400 MG TABS tablet Take 400 mg by mouth as directed.    Historical Provider, MD  LANTUS SOLOSTAR 100 UNIT/ML Solostar Pen Inject 14 Units as directed at bedtime. 09/12/15   Historical Provider, MD  levothyroxine (SYNTHROID, LEVOTHROID) 50 MCG tablet Take 50 mcg by mouth daily.      Historical Provider, MD  meclizine (ANTIVERT) 25 MG tablet Take 25 mg by mouth 3 (three) times daily as needed for dizziness.    Historical Provider, MD  metFORMIN (GLUCOPHAGE) 500 MG tablet Take 1,000 mg by mouth 2 (two) times daily with a meal.     Historical Provider, MD  metoprolol  (LOPRESSOR) 50 MG tablet Take 50 mg by mouth 2 (two) times daily.    Historical Provider, MD  naproxen sodium (ALEVE) 220 MG tablet Take 220 mg by mouth 2 (two) times daily with a meal.    Historical Provider, MD  nitroGLYCERIN (NITROSTAT) 0.4 MG SL tablet Place 1 tablet (0.4 mg total) under the tongue every 5 (five) minutes as needed. 10/06/15   Vesta Mixer, MD  polyvinyl alcohol (LIQUITEARS) 1.4 % ophthalmic solution Place 1 drop into both eyes as needed for dry eyes.    Historical Provider,  MD  propranolol (INDERAL) 10 MG tablet Take 1 tablet (10 mg total) by mouth daily as needed. For palpitations 10/06/15   Vesta Mixer, MD  tamsulosin (FLOMAX) 0.4 MG CAPS capsule Take 0.4 mg by mouth daily. 09/23/15   Historical Provider, MD  vitamin B-12 (CYANOCOBALAMIN) 100 MCG tablet Take 100 mcg by mouth daily.    Historical Provider, MD  Vitamin D, Cholecalciferol, 1000 units CAPS Take 1,000 mg by mouth daily.    Historical Provider, MD   BP 180/65 mmHg  Pulse 56  Temp(Src) 98.7 F (37.1 C)  Resp 18  Ht  (1.88 m)  Wt 205 lb 6 oz (93.157 kg)  BMI 26.36 kg/m2  SpO2 96% Physical Exam  Constitutional: He is oriented to person, place, and time. No distress.  Elderly  HENT:  Head: Normocephalic and atraumatic.  Neck: Neck supple.  Cardiovascular: Normal rate, regular rhythm and normal heart sounds.   No murmur heard. Pulmonary/Chest: Effort normal and breath sounds normal. No stridor. No respiratory distress. He has no wheezes.  Abdominal: Soft. Bowel sounds are normal. There is no tenderness. There is no rebound and no guarding.  Musculoskeletal: He exhibits edema.  Neurological: He is alert and oriented to person, place, and time.  Skin: Skin is warm and dry.  Nursing note and vitals reviewed.   ED Course  Procedures  DIAGNOSTIC STUDIES:  Oxygen Saturation is 96% on RA, normal by my interpretation.    COORDINATION OF CARE:  3:38 AM Will order chest x-ray and administer GI  cocktail. Discussed treatment plan with pt at bedside and pt agreed to plan.  Labs Review Labs Reviewed - No data to display  Imaging Review Dg Chest 2 View  12/29/2015  CLINICAL DATA:  Patient's walled a full well pill package. Checking for foreign body. EXAM: CHEST  2 VIEW COMPARISON:  10/22/2009.  Abdomen 12/28/2015. FINDINGS: Postoperative changes in the mediastinum. Heart size and pulmonary vascularity are normal for technique. Emphysematous changes in the lungs. Fibrosis in both lungs with peripheral and basilar distribution suggesting usual interstitial pneumonitis. Calcified pleural plaques bilaterally. No blunting of costophrenic angles. No pneumothorax. Mediastinal contours appear intact. Degenerative changes in the spine and shoulders. Calcification of the aorta. No radiopaque foreign bodies identified. IMPRESSION: Chronic fibrosis and emphysematous changes with bilateral calcified pleural plaques. No evidence of active pulmonary disease. Electronically Signed   By: Burman Nieves M.D.   On: 12/29/2015 04:17   Dg Abd 2 Views  12/29/2015  CLINICAL DATA:  Swallowed a foil pill packet today. Epigastric pain. EXAM: ABDOMEN - 2 VIEW COMPARISON:  None. FINDINGS: The abdominal gas pattern is negative for obstruction or perforation. Extensive vascular calcifications are present. No biliary or urinary calculi are evident. IMPRESSION: Negative for obstruction or perforation. Electronically Signed   By: Ellery Plunk M.D.   On: 12/29/2015 00:02   I have personally reviewed and evaluated these images and lab results as part of my medical decision-making.   EKG Interpretation None      MDM   Final diagnoses:  Swallowed foreign body, initial encounter    Patient presents with concern for swallowed foreign body. It was a small piece of plastic and foil containing one of his nightly pills. Less than 1 inch in diameter. Plain films are without evidence of foreign body. He is nontoxic. No  stridor. No evidence of respiratory distress. Does report some persistent discomfort and sore throat as well as upper abdominal discomfort. Nontoxic on exam. No signs of  peritonitis. He was given a GI cocktail with relief of his discomfort. Suspect that he has some minor trauma given the jagged edges of the packaging. Discussed at length with the patient that he would likely pass this without incident. He does have a history of bowel surgeries. If he develops increasing pain, nausea, vomiting or any new or worsening symptoms he was encouraged to return. Otherwise expectant management. He is able to tolerate fluids without difficulty prior to discharge.  After history, exam, and medical workup I feel the patient has been appropriately medically screened and is safe for discharge home. Pertinent diagnoses were discussed with the patient. Patient was given return precautions.   I personally performed the services described in this documentation, which was scribed in my presence. The recorded information has been reviewed and is accurate.    Shon Baton, MD 12/29/15 [phone]

## 2015-12-31 ENCOUNTER — Ambulatory Visit (HOSPITAL_COMMUNITY): Payer: Medicare Other | Admitting: Physical Therapy

## 2016-01-05 ENCOUNTER — Encounter (HOSPITAL_COMMUNITY): Payer: Medicare Other

## 2016-01-07 ENCOUNTER — Encounter (HOSPITAL_COMMUNITY): Payer: Medicare Other

## 2016-09-28 DEATH — deceased

## 2017-03-28 DEATH — deceased

## 2018-03-19 IMAGING — CR DG CHEST 2V
2 series · 2 of 2 positions shown · non-contrast
Comparison: 10/22/2009.  Abdomen 12/28/2015.

CLINICAL DATA: Patient's walled a full well pill package. Checking
for foreign body.

EXAM:
CHEST  2 VIEW

[chest pa]
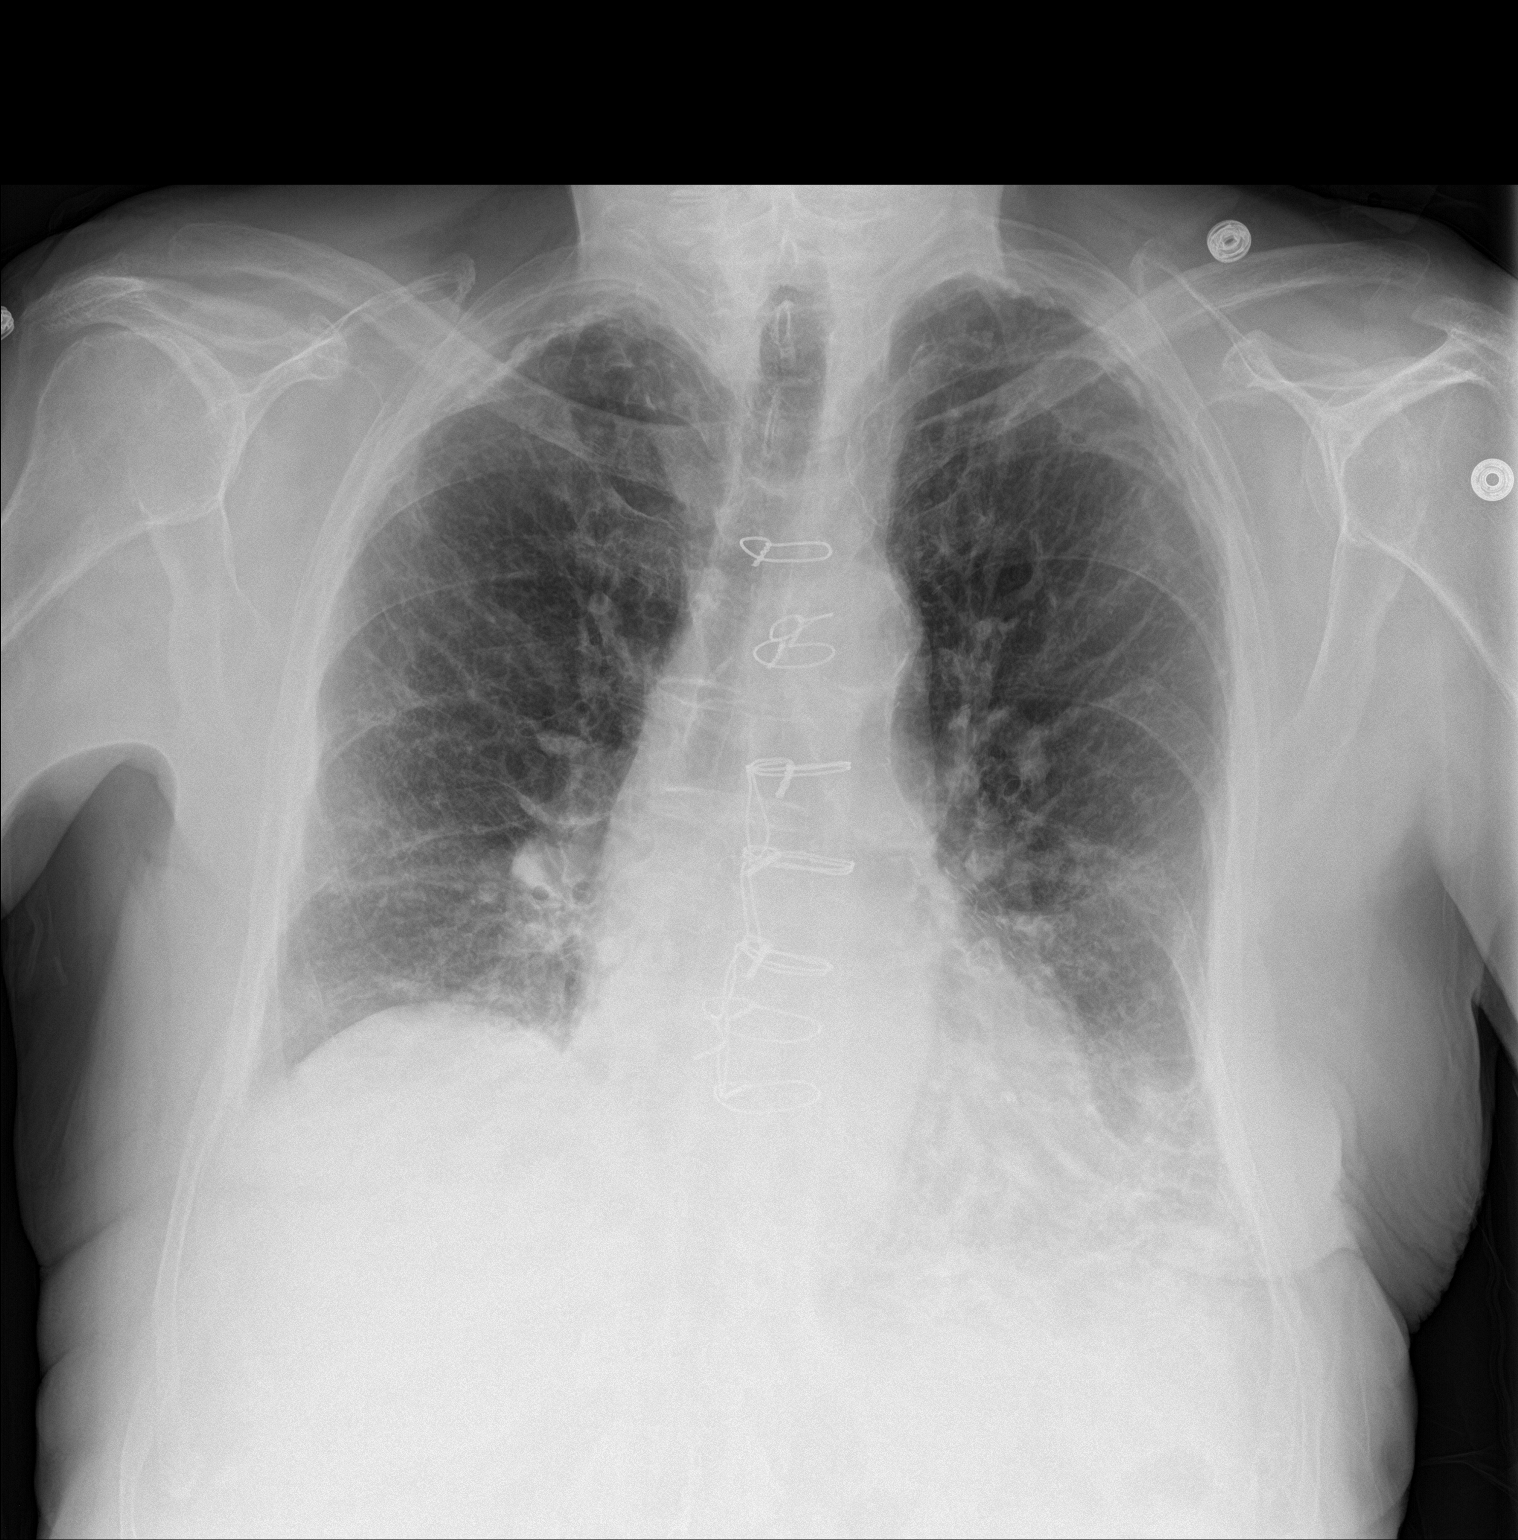

[chest lat]
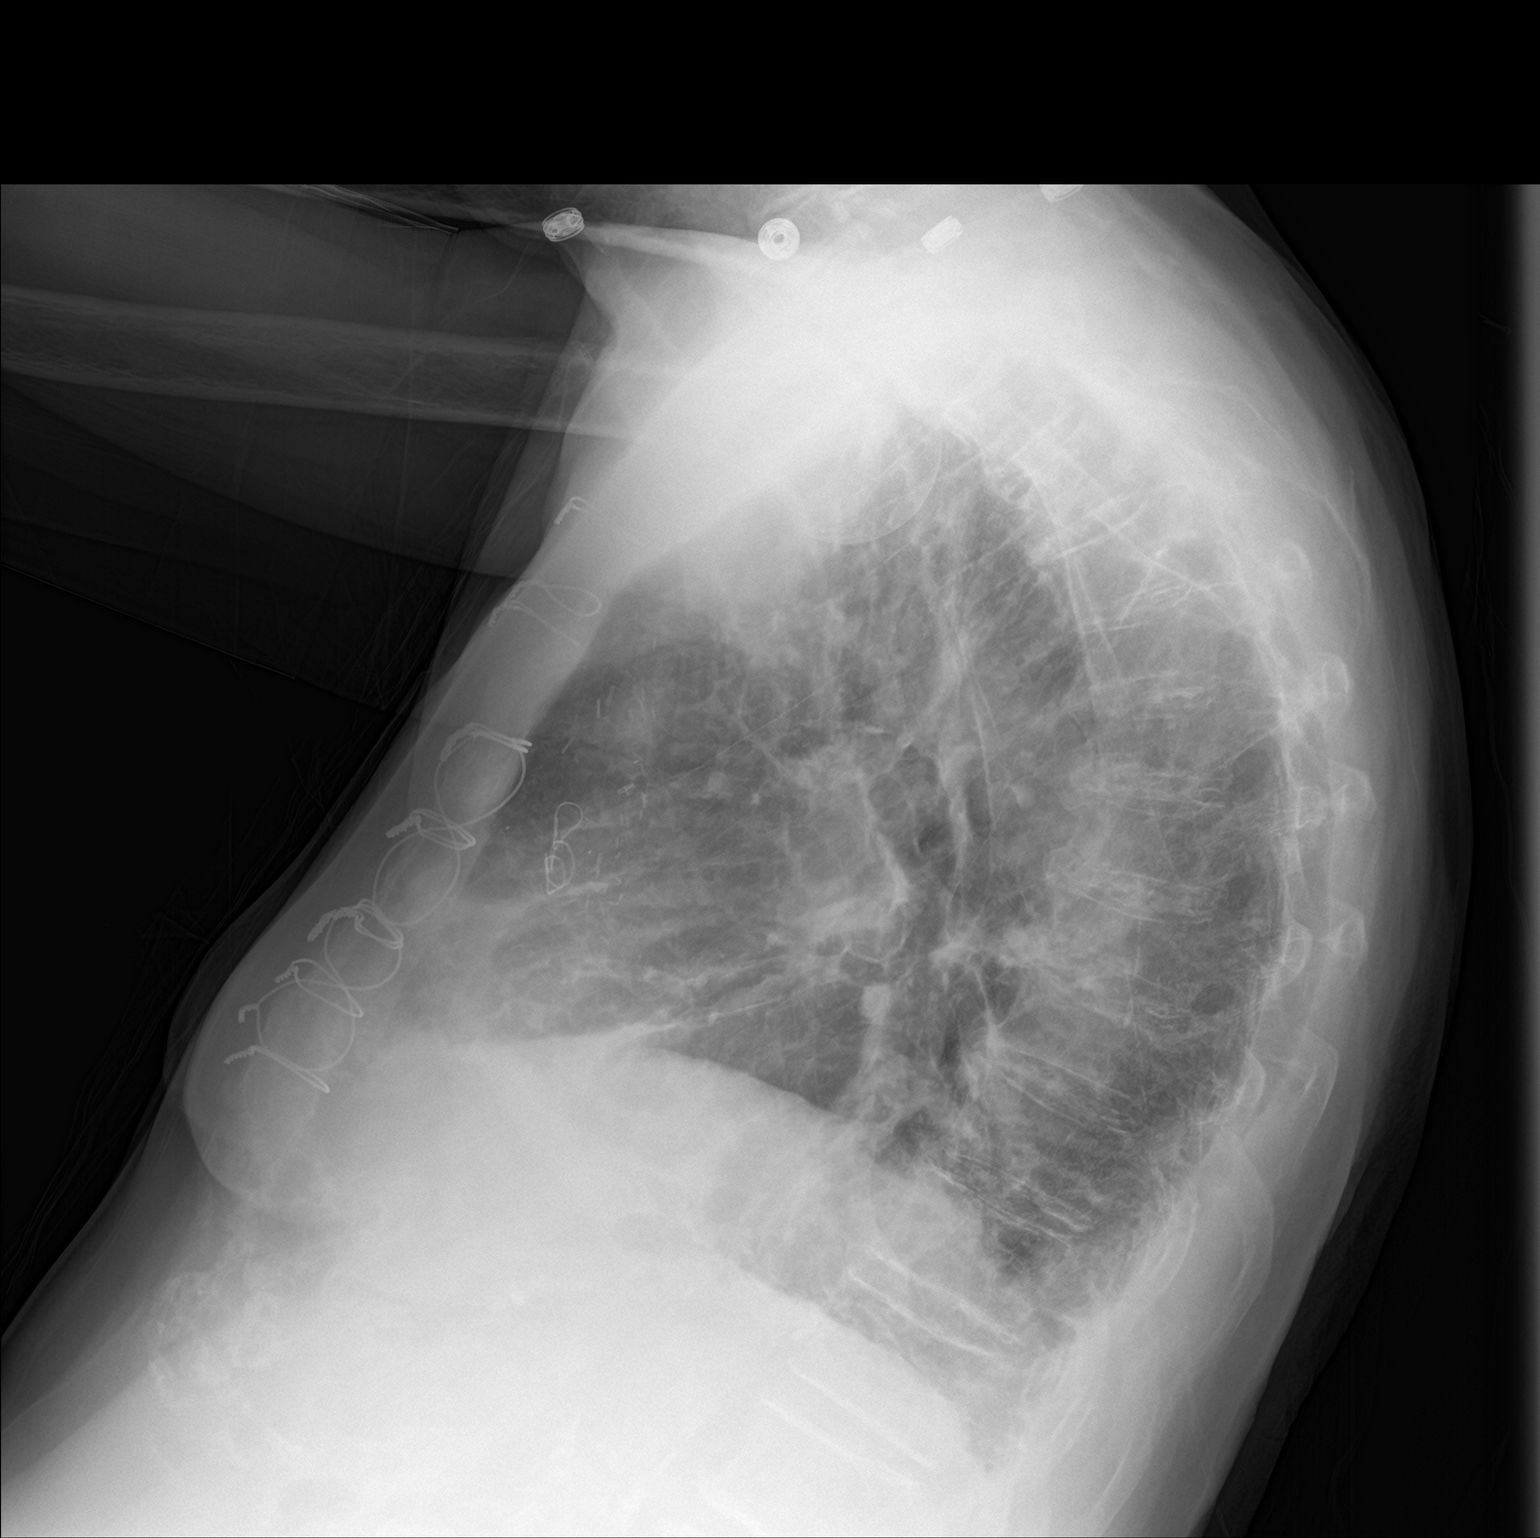

[2 of 2 positions shown; findings below may reference images not displayed]

FINDINGS: Postoperative changes in the mediastinum. Heart size and pulmonary
vascularity are normal for technique. Emphysematous changes in the
lungs. Fibrosis in both lungs with peripheral and basilar
distribution suggesting usual interstitial pneumonitis. Calcified
pleural plaques bilaterally. No blunting of costophrenic angles. No
pneumothorax. Mediastinal contours appear intact. Degenerative
changes in the spine and shoulders. Calcification of the aorta. No
radiopaque foreign bodies identified.
IMPRESSION: Chronic fibrosis and emphysematous changes with bilateral calcified
pleural plaques. No evidence of active pulmonary disease.
